# Patient Record
Sex: Male | Born: 1970 | Race: White | Hispanic: No | Smoking: Never smoker
Health system: Southern US, Community
[De-identification: ages and names within clinical notes are randomized; demographics above are authoritative.]

## PROBLEM LIST (undated history)

## (undated) DIAGNOSIS — K219 Gastro-esophageal reflux disease without esophagitis: Secondary | ICD-10-CM

## (undated) DIAGNOSIS — E7800 Pure hypercholesterolemia, unspecified: Secondary | ICD-10-CM

## (undated) HISTORY — DX: Gastro-esophageal reflux disease without esophagitis: K21.9

## (undated) HISTORY — PX: NO PAST SURGERIES: SHX2092

## (undated) HISTORY — DX: Pure hypercholesterolemia, unspecified: E78.00

---

## 1998-04-23 ENCOUNTER — Encounter: Admission: RE | Admit: 1998-04-23 | Discharge: 1998-07-22 | Payer: Self-pay | Admitting: Family Medicine

## 2004-05-10 ENCOUNTER — Emergency Department (HOSPITAL_COMMUNITY): Admission: EM | Admit: 2004-05-10 | Discharge: 2004-05-10 | Payer: Self-pay | Admitting: Emergency Medicine

## 2011-12-27 ENCOUNTER — Other Ambulatory Visit: Payer: Self-pay | Admitting: Family Medicine

## 2011-12-27 DIAGNOSIS — R1013 Epigastric pain: Secondary | ICD-10-CM

## 2012-01-09 ENCOUNTER — Ambulatory Visit (HOSPITAL_COMMUNITY)
Admission: RE | Admit: 2012-01-09 | Discharge: 2012-01-09 | Disposition: A | Discharge status: Home / Self Care | Payer: BC Managed Care – PPO | Source: Ambulatory Visit | Attending: Family Medicine | Admitting: Family Medicine

## 2012-01-09 ENCOUNTER — Other Ambulatory Visit: Payer: Self-pay | Admitting: Family Medicine

## 2012-01-09 DIAGNOSIS — R1013 Epigastric pain: Secondary | ICD-10-CM

## 2012-01-09 DIAGNOSIS — K3189 Other diseases of stomach and duodenum: Secondary | ICD-10-CM | POA: Insufficient documentation

## 2012-01-09 DIAGNOSIS — R109 Unspecified abdominal pain: Secondary | ICD-10-CM | POA: Insufficient documentation

## 2013-02-15 ENCOUNTER — Ambulatory Visit (INDEPENDENT_AMBULATORY_CARE_PROVIDER_SITE_OTHER): Payer: BC Managed Care – PPO | Admitting: Cardiovascular Disease

## 2013-02-15 ENCOUNTER — Encounter: Payer: Self-pay | Admitting: Cardiovascular Disease

## 2013-02-15 VITALS — BP 114/76 | HR 74 | Ht 72.0 in | Wt 192.0 lb

## 2013-02-15 DIAGNOSIS — E785 Hyperlipidemia, unspecified: Secondary | ICD-10-CM | POA: Insufficient documentation

## 2013-02-15 DIAGNOSIS — R079 Chest pain, unspecified: Secondary | ICD-10-CM | POA: Insufficient documentation

## 2013-02-15 DIAGNOSIS — R072 Precordial pain: Secondary | ICD-10-CM | POA: Insufficient documentation

## 2013-02-15 NOTE — Patient Instructions (Addendum)
Your physician has requested that you have an exercise tolerance test. For further information please visit [email or web address] Please also follow instruction sheet, as given.  Your physician wants you to follow-up in: 12 months with Dr. Excell Seltzer. You will receive a reminder letter in the mail two months in advance. If you don't receive a letter, please call our office to schedule the follow-up appointment.  Stop taking Red Yeast Rice and Fish Oil.

## 2013-02-15 NOTE — Progress Notes (Signed)
HPI:  42 year old gentleman presenting for initial cardiac evaluation. The patient has a history of chest pain and strong family history of premature coronary artery disease. His father had multivessel CABG at age 17.  The patient has developed chest pain at rest and with exertion. He describes a pressure like sensation across the chest with associated shortness of breath. He also has very bad acid reflux and has difficulty differentiating his symptoms. However, he has had chest pressure with physical exercise. He started taking Prilosec about 6 weeks ago and feels much better since this is been initiated. He's had no more resting chest discomfort. He has not resumed exercise because he's been afraid to do so. He denies leg swelling, palpitations, lightheadedness, syncope, orthopnea, or PND.  He has added red yeast rice and fish oil to his medical program. He's noticed some shoulder and elbow aching over the last 4 weeks since these medication changes were made. He's been maintained on simvastatin for many years.  Outpatient Encounter Prescriptions as of 02/15/2013  Medication Sig Dispense Refill  . aspirin 325 MG tablet Take 325 mg by mouth daily.      Marland Kitchen omeprazole (PRILOSEC) 40 MG capsule Take 40 mg by mouth daily.      . simvastatin (ZOCOR) 40 MG tablet Take 20 mg by mouth every evening.      . [DISCONTINUED] fish oil-omega-3 fatty acids 1000 MG capsule Take 2 g by mouth daily. 1500      . [DISCONTINUED] Red Yeast Rice Extract (RED YEAST RICE PO) Take by mouth daily.       No facility-administered encounter medications on file as of 02/15/2013.    Review of patient's allergies indicates no known allergies.  Past Medical History  Diagnosis Date  . Acid reflux   . High cholesterol     History reviewed. No pertinent past surgical history.  History   Social History  . Marital Status: Married    Spouse Name: N/A    Number of Children: N/A  . Years of Education: N/A   Occupational  History  . Not on file.   Social History Main Topics  . Smoking status: Never Smoker   . Smokeless tobacco: Not on file  . Alcohol Use: Yes  . Drug Use: No  . Sexually Active: Not on file   Other Topics Concern  . Not on file   Social History Narrative  . No narrative on file    Family History  Problem Relation Age of Onset  . Heart disease Father     ROS:  General: no fevers/chills/night sweats Eyes: no blurry vision, diplopia, or amaurosis ENT: no sore throat or hearing loss Resp: no cough, wheezing, or hemoptysis CV: no edema or palpitations GI: no abdominal pain, nausea, vomiting, diarrhea, or constipation GU: no dysuria, frequency, or hematuria Skin: no rash Neuro: no headache, numbness, tingling, or weakness of extremities Musculoskeletal: Shoulder and elbow pains Heme: no bleeding, DVT, or easy bruising Endo: no polydipsia or polyuria  BP 114/76  Pulse 74  Ht 6' (1.829 m)  Wt 192 lb (87.091 kg)  BMI 26.03 kg/m2  SpO2 97%  PHYSICAL EXAM: Pt is alert and oriented, WD, WN, in no distress. HEENT: normal Neck: JVP normal. Carotid upstrokes normal without bruits. No thyromegaly. Lungs: equal expansion, clear bilaterally CV: Apex is discrete and nondisplaced, RRR without murmur or gallop Abd: soft, NT, +BS, no bruit, no hepatosplenomegaly Back: no CVA tenderness Ext: no C/C/E  Femoral pulses 2+= without bruits        DP/PT pulses intact and = Skin: warm and dry without rash Neuro: CNII-XII intact             Strength intact = bilaterally  EKG:  EKG shows normal sinus rhythm 67 beats per minute, within normal limits.  ASSESSMENT AND PLAN: 1. Chest pain with typical and atypical features. Of primary concern is his family history of premature CAD. It is reassuring that his chest pain has improved with Prilosec. I have recommended that he have an exercise treadmill stress test. He appears physically fit and I think we'll be able to do well on a treadmill  test. His 12-lead EKG is within normal limits. I asked him not to resume regular exercise until his treadmill is completed. He will continue aspirin and his statin drug.  2. Hyperlipidemia. I reviewed his lab work from February. His LDL is 58. His LDL particle number is 767. HDL is 45, and total cholesterol is 125. As he has developed myalgias, I have recommended that he stop taking red yeast rice. I also recommended that he discontinue fish oil because of his problems with acid reflux. I think he should remain on a statin drug long-term.  For followup, he will have a treadmill study as detailed above. I would like to see him back in 12 months as long as his treadmill is within normal limits.  Andrew [name] 02/15/2013 1:34 PM

## 2013-02-27 ENCOUNTER — Encounter: Payer: Self-pay | Admitting: *Deleted

## 2013-02-27 ENCOUNTER — Encounter: Payer: Self-pay | Admitting: Cardiovascular Disease

## 2013-03-01 ENCOUNTER — Ambulatory Visit (INDEPENDENT_AMBULATORY_CARE_PROVIDER_SITE_OTHER): Payer: BC Managed Care – PPO | Admitting: Physician Assistant

## 2013-03-01 DIAGNOSIS — R079 Chest pain, unspecified: Secondary | ICD-10-CM

## 2013-03-01 NOTE — Progress Notes (Signed)
Exercise Treadmill Test  Pre-Exercise Testing Evaluation Rhythm: normal sinus  Rate: 68                 Test  Exercise Tolerance Test Ordering MD: Tonny Bollman, MD  Interpreting MD: Tereso Newcomer, PA-C  Unique Test No: 1  Treadmill:  1  Indication for ETT: chest pain - rule out ischemia  Contraindication to ETT: No   Stress Modality: exercise - treadmill  Cardiac Imaging Performed: non   Protocol: standard Bruce - maximal  Max BP:  166/81  Max MPHR (bpm):  179 85% MPR (bpm):  152  MPHR obtained (bpm):  190 % MPHR obtained:  106%  Reached 85% MPHR (min:sec):  8:37 Total Exercise Time (min-sec):  10:30  Workload in METS:  12.4 Borg Scale: 19  Reason ETT Terminated:  patient's desire to stop    ST Segment Analysis At Rest: normal ST segments - no evidence of significant ST depression With Exercise: no evidence of significant ST depression  Other Information Arrhythmia:  Frequent PVCs during exercise and recovery  Angina during ETT:  absent (0) Quality of ETT:  diagnostic  ETT Interpretation:  normal - no evidence of ischemia by ST analysis  Comments: Excellent exercise tolerance. No chest pain. Normal BP response to exercise. No ST-T changes to suggest ischemia.  Recommendations: F/u with Dr. Tonny Bollman as directed. Signed,  Tereso Newcomer, PA-C  1:09 PM 03/01/2013

## 2013-10-07 ENCOUNTER — Other Ambulatory Visit: Payer: Self-pay | Admitting: Family Medicine

## 2013-10-08 NOTE — Telephone Encounter (Signed)
Last seen 02/14

## 2013-12-09 ENCOUNTER — Other Ambulatory Visit: Payer: Self-pay | Admitting: Nurse Practitioner

## 2014-01-08 ENCOUNTER — Other Ambulatory Visit: Payer: Self-pay | Admitting: Nurse Practitioner

## 2014-01-09 NOTE — Telephone Encounter (Signed)
Last visit 01/07/13 with you

## 2014-01-10 NOTE — Telephone Encounter (Signed)
Patient needs to be seen. Has exceeded time since last visit. Limited quantity refilled. Needs to bring all medications to next appointment.   

## 2014-03-07 ENCOUNTER — Ambulatory Visit: Payer: BC Managed Care – PPO | Admitting: Cardiovascular Disease

## 2014-03-08 ENCOUNTER — Other Ambulatory Visit: Payer: Self-pay | Admitting: Family Medicine

## 2014-03-17 ENCOUNTER — Encounter: Payer: Self-pay | Admitting: Cardiovascular Disease

## 2014-03-19 ENCOUNTER — Ambulatory Visit: Payer: Self-pay | Admitting: Cardiovascular Disease

## 2014-04-24 ENCOUNTER — Telehealth: Payer: Self-pay | Admitting: Family Medicine

## 2014-04-24 ENCOUNTER — Ambulatory Visit (INDEPENDENT_AMBULATORY_CARE_PROVIDER_SITE_OTHER): Payer: BC Managed Care – PPO | Admitting: Family Medicine

## 2014-04-24 VITALS — BP 117/72 | HR 87 | Temp 99.0°F | Ht 72.0 in | Wt 189.0 lb

## 2014-04-24 DIAGNOSIS — J029 Acute pharyngitis, unspecified: Secondary | ICD-10-CM

## 2014-04-24 DIAGNOSIS — R509 Fever, unspecified: Secondary | ICD-10-CM

## 2014-04-24 LAB — POCT RAPID STREP A (OFFICE): Rapid Strep A Screen: POSITIVE — AB

## 2014-04-24 MED ORDER — CEFTRIAXONE SODIUM 1 G IJ SOLR
1.0000 g | INTRAMUSCULAR | Status: AC
  Administered 2014-04-24: 1 g via INTRAMUSCULAR

## 2014-04-24 MED ORDER — HYDROCODONE-ACETAMINOPHEN 5-325 MG PO TABS: 1.0000 | ORAL_TABLET | Freq: Four times a day (QID) | ORAL | Status: DC | PRN

## 2014-04-24 MED ORDER — PENICILLIN V POTASSIUM 500 MG PO TABS: 500.0000 mg | ORAL_TABLET | Freq: Four times a day (QID) | ORAL | Status: DC

## 2014-04-24 NOTE — Telephone Encounter (Signed)
appt given for 6:15 with bill

## 2014-04-24 NOTE — Progress Notes (Signed)
   Subjective:    Patient ID: Andrew [name], male    DOB: Jun 13, 1971, 43 y.o.   MRN: [id number]  HPI  This 43 y.o. male presents for evaluation of severe sore throat pain swollen tonsils and fever.  Review of Systems C/o sore throat and fever   No chest pain, SOB, HA, dizziness, vision change, N/V, diarrhea, constipation, dysuria, urinary urgency or frequency, myalgias, arthralgias or rash.  Objective:   Physical Exam Vital signs noted  Well developed well nourished male.  HEENT - Head atraumatic Normocephalic                Eyes - PERRLA, Conjuctiva - clear Sclera- Clear EOMI                Ears - EAC's Wnl TM's Wnl Gross Hearing WNL                Nose - Nares patent                 Throat - oropharanx 3 plus injected tonsils Respiratory - Lungs CTA bilateral Cardiac - RRR S1 and S2 without murmur GI - Abdomen soft Nontender and bowel sounds active x 4        Assessment & Plan:  Sore throat - Plan: POCT rapid strep A, penicillin v potassium (VEETID) 500 MG tablet, cefTRIAXone (ROCEPHIN) injection 1 g, HYDROcodone-acetaminophen (NORCO) 5-325 MG per tablet  Fever - Plan: POCT rapid strep A, penicillin v potassium (VEETID) 500 MG tablet, cefTRIAXone (ROCEPHIN) injection 1 g, HYDROcodone-acetaminophen (NORCO) 5-325 MG per tablet  Acute pharyngitis - Plan: penicillin v potassium (VEETID) 500 MG tablet, cefTRIAXone (ROCEPHIN) injection 1 g, HYDROcodone-acetaminophen (NORCO) 5-325 MG per tablet  Push po fluids, rest, tylenol and motrin otc prn as directed for fever, arthralgias, and myalgias.  Follow up prn if sx's continue or persist.  Deatra Canter FNP

## 2014-04-25 ENCOUNTER — Other Ambulatory Visit (INDEPENDENT_AMBULATORY_CARE_PROVIDER_SITE_OTHER): Payer: BC Managed Care – PPO

## 2014-04-25 DIAGNOSIS — E785 Hyperlipidemia, unspecified: Secondary | ICD-10-CM

## 2014-04-25 NOTE — Progress Notes (Signed)
Pt came in for lab  only 

## 2014-04-26 LAB — CMP14+EGFR
ALT: 16 IU/L (ref 0–44)
AST: 18 IU/L (ref 0–40)
Albumin/Globulin Ratio: 1.6 (ref 1.1–2.5)
Albumin: 4.4 g/dL (ref 3.5–5.5)
Alkaline Phosphatase: 97 IU/L (ref 39–117)
BUN/Creatinine Ratio: 10 (ref 9–20)
BUN: 10 mg/dL (ref 6–24)
CO2: 25 mmol/L (ref 18–29)
Calcium: 9.5 mg/dL (ref 8.7–10.2)
Chloride: 97 mmol/L (ref 97–108)
Creatinine, Ser: 1.03 mg/dL (ref 0.76–1.27)
GFR calc Af Amer: 103 mL/min/{1.73_m2} (ref >59)
GFR calc non Af Amer: 89 mL/min/{1.73_m2} (ref >59)
Globulin, Total: 2.7 g/dL (ref 1.5–4.5)
Glucose: 90 mg/dL (ref 65–99)
Potassium: 4.1 mmol/L (ref 3.5–5.2)
Sodium: 138 mmol/L (ref 134–144)
Total Bilirubin: 0.4 mg/dL (ref 0.0–1.2)
Total Protein: 7.1 g/dL (ref 6.0–8.5)

## 2014-04-26 LAB — LIPID PANEL
Chol/HDL Ratio: 3.8 ratio units (ref 0.0–5.0)
Cholesterol, Total: 158 mg/dL (ref 100–199)
HDL: 42 mg/dL (ref >39)
LDL Calculated: 98 mg/dL (ref 0–99)
Triglycerides: 91 mg/dL (ref 0–149)
VLDL Cholesterol Cal: 18 mg/dL (ref 5–40)

## 2015-10-01 ENCOUNTER — Ambulatory Visit (INDEPENDENT_AMBULATORY_CARE_PROVIDER_SITE_OTHER): Payer: 59 | Admitting: Family Medicine

## 2015-10-01 ENCOUNTER — Encounter: Payer: Self-pay | Admitting: Family Medicine

## 2015-10-01 VITALS — BP 122/77 | HR 84 | Temp 97.4°F | Ht 72.0 in | Wt 200.2 lb

## 2015-10-01 DIAGNOSIS — R0789 Other chest pain: Secondary | ICD-10-CM | POA: Diagnosis not present

## 2015-10-01 DIAGNOSIS — K219 Gastro-esophageal reflux disease without esophagitis: Secondary | ICD-10-CM | POA: Insufficient documentation

## 2015-10-01 MED ORDER — OMEPRAZOLE 40 MG PO CPDR: 40.0000 mg | DELAYED_RELEASE_CAPSULE | Freq: Every day | ORAL | Status: DC

## 2015-10-06 NOTE — Progress Notes (Signed)
BP 122/77 mmHg  Pulse 84  Temp(Src) 97.4 F (36.3 C) (Oral)  Ht 6' (1.829 m)  Wt 200 lb 3.2 oz (90.81 kg)  BMI 27.15 kg/m2   Subjective:    Patient ID: Andrew [name], male    DOB: 11/30/1970, 44 y.o.   MRN: [id number]  HPI: Andrew [name] is a 44 y.o. male presenting on 10/01/2015 for Gastroesophageal Reflux   HPI  Chest pressure and heartburn Patient presents today with chest pressure and tightness and heartburn. He has been taking omeprazole 20 mg daily for a few years but feels like it is not working as well as it used to. He describes it as an indigestion and chest tightness up in the epigastric region and lower left chest. He denies any palpitations or shortness of breath. He is concerned about his heart because of extensive family history and wonders if he can get an EKG. He denies any lightheadedness or dizziness. He does admit to belching and burping associated with it and the occasional burning going up into his throat.  Relevant past medical, surgical, family and social history reviewed and updated as indicated. Interim medical history since our last visit reviewed. Allergies and medications reviewed and updated.  Review of Systems  Constitutional: Negative for fever.  HENT: Negative for ear discharge and ear pain.   Eyes: Negative for discharge and visual disturbance.  Respiratory: Negative for shortness of breath and wheezing.   Cardiovascular: Positive for chest pain (pressure in the lower left ribs). Negative for leg swelling.  Gastrointestinal: Positive for nausea (Belching and burping and acid) and abdominal pain. Negative for diarrhea, constipation and abdominal distention.  Genitourinary: Negative for difficulty urinating.  Musculoskeletal: Negative for back pain and gait problem.  Skin: Negative for rash.  Neurological: Negative for dizziness, syncope, light-headedness and headaches.  All other systems reviewed and are negative.   Per HPI unless  specifically indicated above     Medication List       This list is accurate as of: 10/01/15 11:59 PM.  Always use your most recent med list.               aspirin 325 MG tablet  Take 325 mg by mouth daily.     omeprazole 40 MG capsule  Commonly known as:  PRILOSEC  Take 1 capsule (40 mg total) by mouth daily.           Objective:    BP 122/77 mmHg  Pulse 84  Temp(Src) 97.4 F (36.3 C) (Oral)  Ht 6' (1.829 m)  Wt 200 lb 3.2 oz (90.81 kg)  BMI 27.15 kg/m2  Wt Readings from Last 3 Encounters:  10/01/15 200 lb 3.2 oz (90.81 kg)  04/24/14 189 lb (85.73 kg)  02/15/13 192 lb (87.091 kg)    Physical Exam  Constitutional: He is oriented to person, place, and time. He appears well-developed and well-nourished. No distress.  Eyes: Conjunctivae and EOM are normal. Pupils are equal, round, and reactive to light. Right eye exhibits no discharge. No scleral icterus.  Neck: Neck supple. No thyromegaly present.  Cardiovascular: Normal rate, regular rhythm, normal heart sounds and intact distal pulses.   No murmur heard. Pulmonary/Chest: Effort normal and breath sounds normal. No respiratory distress. He has no wheezes.  Abdominal: Soft. Bowel sounds are normal. He exhibits no distension. There is tenderness (Epigastric). There is no rebound and no guarding.  Musculoskeletal: Normal range of motion. He exhibits no edema.  Lymphadenopathy:  He has no cervical adenopathy.  Neurological: He is alert and oriented to person, place, and time. Coordination normal.  Skin: Skin is warm and dry. No rash noted. He is not diaphoretic.  Psychiatric: He has a normal mood and affect. His behavior is normal.  Vitals reviewed.   Results for orders placed or performed in visit on 04/25/14  CMP14+EGFR  Result Value Ref Range   Glucose 90 65 - 99 mg/dL   BUN 10 6 - 24 mg/dL   Creatinine, Ser 1.03 0.76 - 1.27 mg/dL   GFR calc non Af Amer 89 >59 mL/min/1.73   GFR calc Af Amer 103 >59  mL/min/1.73   BUN/Creatinine Ratio 10 9 - 20   Sodium 138 134 - 144 mmol/L   Potassium 4.1 3.5 - 5.2 mmol/L   Chloride 97 97 - 108 mmol/L   CO2 25 18 - 29 mmol/L   Calcium 9.5 8.7 - 10.2 mg/dL   Total Protein 7.1 6.0 - 8.5 g/dL   Albumin 4.4 3.5 - 5.5 g/dL   Globulin, Total 2.7 1.5 - 4.5 g/dL   Albumin/Globulin Ratio 1.6 1.1 - 2.5   Total Bilirubin 0.4 0.0 - 1.2 mg/dL   Alkaline Phosphatase 97 39 - 117 IU/L   AST 18 0 - 40 IU/L   ALT 16 0 - 44 IU/L  Lipid panel  Result Value Ref Range   Cholesterol, Total 158 100 - 199 mg/dL   Triglycerides 91 0 - 149 mg/dL   HDL 42 >39 mg/dL   VLDL Cholesterol Cal 18 5 - 40 mg/dL   LDL Calculated 98 0 - 99 mg/dL   Chol/HDL Ratio 3.8 0.0 - 5.0 ratio units   EKG: EKG shows normal sinus rhythm    Assessment & Plan:       Problem List Items Addressed This Visit      Digestive   GERD (gastroesophageal reflux disease) - Primary   Relevant Medications   omeprazole (PRILOSEC) 40 MG capsule    Other Visit Diagnoses    Chest tightness or pressure        Relevant Orders    EKG 12-Lead (Completed)        Follow up plan: Return if symptoms worsen or fail to improve.  Caryl Pina, MD [facility] 10/01/2015, 5:01 PM

## 2015-12-21 ENCOUNTER — Encounter: Payer: Self-pay | Admitting: Pediatrics

## 2015-12-21 ENCOUNTER — Telehealth: Payer: Self-pay | Admitting: *Deleted

## 2015-12-21 ENCOUNTER — Ambulatory Visit (INDEPENDENT_AMBULATORY_CARE_PROVIDER_SITE_OTHER): Payer: BLUE CROSS/BLUE SHIELD | Admitting: Pediatrics

## 2015-12-21 VITALS — BP 114/79 | HR 97 | Temp 97.7°F | Ht 73.0 in | Wt 199.2 lb

## 2015-12-21 DIAGNOSIS — J020 Streptococcal pharyngitis: Secondary | ICD-10-CM

## 2015-12-21 DIAGNOSIS — R6889 Other general symptoms and signs: Secondary | ICD-10-CM

## 2015-12-21 LAB — POCT INFLUENZA A/B
Influenza A, POC: NEGATIVE
Influenza B, POC: NEGATIVE

## 2015-12-21 MED ORDER — GUAIFENESIN-CODEINE 100-10 MG/5ML PO SOLN: 5.0000 mL | Freq: Three times a day (TID) | ORAL | Status: DC | PRN

## 2015-12-21 MED ORDER — AMOXICILLIN 500 MG PO CAPS: 500.0000 mg | ORAL_CAPSULE | Freq: Two times a day (BID) | ORAL | Status: DC

## 2015-12-21 NOTE — Patient Instructions (Addendum)
Ibuprofen  three times a day--no more than  in 24hours  Tylenol  tabs, take 2 no more than 2-3 times a day  Chloraseptic or hurricane throat spray for numbing throat

## 2015-12-21 NOTE — Progress Notes (Signed)
Subjective:    Patient ID: Andrew [name], male    DOB: 1971/11/25, 45 y.o.   MRN: [id number]  CC: Fatigue; Sore Throat; Fever; Chills; and Generalized Body Aches   HPI: Andrew [name] is a 45 y.o. male presenting for Fatigue; Sore Throat; Fever; Chills; and Generalized Body Aches  Woke up 2 days ago feeling completely drained, not able to do weekend plans, not hungry for dinner that night, started having sore throat, had fevers at home up to 102. Has had back pain, cant get comfortable   Depression screen [facility] 2/9 12/21/2015 10/01/2015  Decreased Interest 0 0  Down, Depressed, Hopeless 0 0  PHQ - 2 Score 0 0     Relevant past medical, surgical, family and social history reviewed and updated as indicated. Interim medical history since our last visit reviewed. Allergies and medications reviewed and updated.    ROS: Per HPI unless specifically indicated above  History  Smoking status  . Never Smoker   Smokeless tobacco  . Not on file    Past Medical History Patient Active Problem List   Diagnosis Date Noted  . GERD (gastroesophageal reflux disease) 10/01/2015  . Chest pain 02/15/2013  . Hyperlipidemia 02/15/2013    Current Outpatient Prescriptions  Medication Sig Dispense Refill  . omeprazole (PRILOSEC) 40 MG capsule Take 1 capsule (40 mg total) by mouth daily. 30 capsule 5  . amoxicillin (AMOXIL) 500 MG capsule Take 1 capsule (500 mg total) by mouth 2 (two) times daily. 20 capsule 0  . aspirin 325 MG tablet Take 325 mg by mouth daily. Reported on 12/21/2015    . guaiFENesin-codeine 100-10 MG/5ML syrup Take 5 mLs by mouth 3 (three) times daily as needed for cough. 180 mL 0   No current facility-administered medications for this visit.       Objective:    BP 114/79 mmHg  Pulse 97  Temp(Src) 97.7 F (36.5 C) (Oral)  Ht  (1.854 m)  Wt 199 lb 3.2 oz (90.357 kg)  BMI 26.29 kg/m2  Wt Readings from Last 3 Encounters:  12/21/15 199 lb 3.2 oz  (90.357 kg)  10/01/15 200 lb 3.2 oz (90.81 kg)  04/24/14 189 lb (85.73 kg)     Gen: NAD, alert, cooperative with exam, NCAT EYES: EOMI, no scleral injection or icterus ENT:  TMs pearly gray b/l, OP with erythema, large generous red tonsils.  LYMPH: no cervical LAD CV: NRRR, normal S1/S2, no murmur, distal pulses 2+ b/l Resp: CTABL, no wheezes, normal WOB Abd: +BS, soft, NTND. no guarding or organomegaly Ext: No edema, warm Neuro: Alert and oriented, strength equal b/l UE and LE, coordination grossly normal MSK: normal muscle bulk, no point tenderness over back     Assessment & Plan:    Andrew [name] was seen today for fatigue, sore throat, fever, chills and generalized body aches, neg flu, but due to tonsil inflammation checked rapid strep which was positive. Will send in amoxicillin  twice a day. Discussed symptomatic care, return precautions.   Diagnoses and all orders for this visit:  Flu-like symptoms -     POCT Influenza A/B -     guaiFENesin-codeine 100-10 MG/5ML syrup; Take 5 mLs by mouth 3 (three) times daily as needed for cough.  Streptococcal sore throat -     amoxicillin (AMOXIL) 500 MG capsule; Take 1 capsule (500 mg total) by mouth 2 (two) times daily.   Follow up plan: prn  Rex Kras, MD [place] [facility]  12/21/2015, 1:53 PM

## 2015-12-21 NOTE — Telephone Encounter (Signed)
Patient aware.

## 2015-12-21 NOTE — Telephone Encounter (Signed)
-----   Message from Johna Sheriff, MD sent at 12/21/2015  1:57 PM EST ----- Regarding: positive strep Can you let him know his strep test was positive, I sent in amoxicillin,  BID for ten days. Take all ten days even if better. He should still do all the other symptomatic care like we discussed.

## 2016-01-11 ENCOUNTER — Encounter: Payer: Self-pay | Admitting: Family Medicine

## 2016-01-11 ENCOUNTER — Ambulatory Visit (INDEPENDENT_AMBULATORY_CARE_PROVIDER_SITE_OTHER): Payer: BLUE CROSS/BLUE SHIELD | Admitting: Family Medicine

## 2016-01-11 VITALS — BP 122/84 | HR 71 | Temp 97.3°F | Ht 73.0 in | Wt 201.0 lb

## 2016-01-11 DIAGNOSIS — G4730 Sleep apnea, unspecified: Secondary | ICD-10-CM | POA: Diagnosis not present

## 2016-01-11 DIAGNOSIS — E669 Obesity, unspecified: Secondary | ICD-10-CM

## 2016-01-11 DIAGNOSIS — R0789 Other chest pain: Secondary | ICD-10-CM | POA: Diagnosis not present

## 2016-01-11 NOTE — Progress Notes (Signed)
BP 122/84 mmHg  Pulse 71  Temp(Src) 97.3 F (36.3 C) (Oral)  Ht '6\' 1"'  (1.854 m)  Wt 201 lb (91.173 kg)  BMI 26.52 kg/m2   Subjective:    Patient ID: Andrew [name], male    DOB: 12-08-1970, 45 y.o.   MRN: [id number]  HPI: Andrew [name] is a 45 y.o. male presenting on 01/11/2016 for Discomfort in chest and Possible sleep apnea   HPI Chest pain and pressure Patient has experienced a chest pain on the left lateral ribs extending around to his back. The pain began 9 days ago. He experiences the pain intermittently when he twists or deep breathing or coughs extensively. He had chest pains that were similar but different back in November and had cardiac workup. He denies any cough or shortness of breath or wheezing currently. Initially then the reflux medications helped him extensively to get rid of the that time. Pain is worse upon palpation of the area.  Snoring and gasping for air Patient admits that his girlfriend has recently been telling him that he snoring and taking long pauses between breaths when he is sleeping and sometimes eating gasping for air. He does not know or wake up during these episodes. He does admit that his sleep is non-refreshing and he often feels tired throughout the day.  Relevant past medical, surgical, family and social history reviewed and updated as indicated. Interim medical history since our last visit reviewed. Allergies and medications reviewed and updated.  Review of Systems  Constitutional: Positive for fatigue. Negative for fever and chills.  HENT: Negative for congestion, ear discharge, ear pain, sinus pressure, sneezing and sore throat.   Eyes: Negative for discharge and visual disturbance.  Respiratory: Negative for cough, chest tightness, shortness of breath and wheezing.   Cardiovascular: Positive for chest pain. Negative for palpitations and leg swelling.  Gastrointestinal: Negative for abdominal pain, diarrhea and constipation.    Genitourinary: Negative for difficulty urinating.  Musculoskeletal: Negative for back pain and gait problem.  Skin: Negative for rash.  Neurological: Negative for syncope, light-headedness and headaches.  Psychiatric/Behavioral: Positive for sleep disturbance.  All other systems reviewed and are negative.   Per HPI unless specifically indicated above     Medication List       This list is accurate as of: 01/11/16  6:12 PM.  Always use your most recent med list.               aspirin 325 MG tablet  Take 325 mg by mouth daily. Reported on 12/21/2015     omeprazole 40 MG capsule  Commonly known as:  PRILOSEC  Take 1 capsule (40 mg total) by mouth daily.           Objective:    BP 122/84 mmHg  Pulse 71  Temp(Src) 97.3 F (36.3 C) (Oral)  Ht '6\' 1"'  (1.854 m)  Wt 201 lb (91.173 kg)  BMI 26.52 kg/m2  Wt Readings from Last 3 Encounters:  01/11/16 201 lb (91.173 kg)  12/21/15 199 lb 3.2 oz (90.357 kg)  10/01/15 200 lb 3.2 oz (90.81 kg)    Physical Exam  Constitutional: He is oriented to person, place, and time. He appears well-developed and well-nourished. No distress.  HENT:  Right Ear: External ear normal.  Left Ear: External ear normal.  Nose: Nose normal.  Mouth/Throat: Oropharynx is clear and moist. No oropharyngeal exudate.  Eyes: Conjunctivae and EOM are normal. Pupils are equal, round, and reactive to light. Right  eye exhibits no discharge. No scleral icterus.  Neck: Neck supple. No thyromegaly present.  Cardiovascular: Normal rate, regular rhythm, normal heart sounds and intact distal pulses.   No murmur heard. Pulmonary/Chest: Effort normal and breath sounds normal. No respiratory distress. He has no wheezes. He exhibits tenderness (Left lateral chest tenderness along the lower ribs extending around posteriorly).  Abdominal: He exhibits no distension. There is no tenderness. There is no rebound and no guarding.  Musculoskeletal: Normal range of motion. He  exhibits no edema.  Lymphadenopathy:    He has no cervical adenopathy.  Neurological: He is alert and oriented to person, place, and time. Coordination normal.  Skin: Skin is warm and dry. No rash noted. He is not diaphoretic.  Psychiatric: He has a normal mood and affect. His behavior is normal.  Vitals reviewed.   Results for orders placed or performed in visit on 12/21/15  POCT Influenza A/B  Result Value Ref Range   Influenza A, POC Negative Negative   Influenza B, POC Negative Negative      Assessment & Plan:   Problem List Items Addressed This Visit      Other   Chest pain - Primary    Other Visit Diagnoses    Sleep apnea        Patient is snoring with signs of sleep apnea would like to go for sleep study.    Relevant Orders    Ambulatory referral to Sleep Studies    CBC with Differential/Platelet (Completed)    Obesity        Relevant Orders    CMP14+EGFR (Completed)    Lipid panel (Completed)    TSH (Completed)    CBC with Differential/Platelet (Completed)        Follow up plan: Return if symptoms worsen or fail to improve.  Counseling provided for all of the vaccine components Orders Placed This Encounter  Procedures  . CMP14+EGFR  . Lipid panel  . TSH  . CBC with Differential/Platelet  . Ambulatory referral to Sleep Studies    Caryl Pina, MD [facility] 01/11/2016, 6:12 PM

## 2016-01-12 ENCOUNTER — Other Ambulatory Visit: Payer: BLUE CROSS/BLUE SHIELD

## 2016-01-12 DIAGNOSIS — G4730 Sleep apnea, unspecified: Secondary | ICD-10-CM

## 2016-01-12 DIAGNOSIS — E669 Obesity, unspecified: Secondary | ICD-10-CM

## 2016-01-13 LAB — CMP14+EGFR
A/G RATIO: 1.3 (ref 1.1–2.5)
ALBUMIN: 4.1 g/dL (ref 3.5–5.5)
ALK PHOS: 91 IU/L (ref 39–117)
ALT: 29 IU/L (ref 0–44)
AST: 17 IU/L (ref 0–40)
BUN / CREAT RATIO: 15 (ref 9–20)
BUN: 17 mg/dL (ref 6–24)
Bilirubin Total: 0.3 mg/dL (ref 0.0–1.2)
CO2: 24 mmol/L (ref 18–29)
CREATININE: 1.15 mg/dL (ref 0.76–1.27)
Calcium: 9.2 mg/dL (ref 8.7–10.2)
Chloride: 99 mmol/L (ref 96–106)
GFR calc Af Amer: 90 mL/min/{1.73_m2} (ref >59)
GFR, EST NON AFRICAN AMERICAN: 78 mL/min/{1.73_m2} (ref >59)
GLOBULIN, TOTAL: 3.1 g/dL (ref 1.5–4.5)
Glucose: 96 mg/dL (ref 65–99)
POTASSIUM: 4.5 mmol/L (ref 3.5–5.2)
SODIUM: 140 mmol/L (ref 134–144)
Total Protein: 7.2 g/dL (ref 6.0–8.5)

## 2016-01-13 LAB — CBC WITH DIFFERENTIAL/PLATELET
BASOS ABS: 0 10*3/uL (ref 0.0–0.2)
Basos: 1 %
EOS (ABSOLUTE): 0.7 10*3/uL — AB (ref 0.0–0.4)
Eos: 9 %
Hematocrit: 47 % (ref 37.5–51.0)
Hemoglobin: 16.4 g/dL (ref 12.6–17.7)
IMMATURE GRANS (ABS): 0 10*3/uL (ref 0.0–0.1)
IMMATURE GRANULOCYTES: 0 %
LYMPHS: 34 %
Lymphocytes Absolute: 2.5 10*3/uL (ref 0.7–3.1)
MCH: 32.5 pg (ref 26.6–33.0)
MCHC: 34.9 g/dL (ref 31.5–35.7)
MCV: 93 fL (ref 79–97)
MONOS ABS: 0.6 10*3/uL (ref 0.1–0.9)
Monocytes: 8 %
NEUTROS PCT: 48 %
Neutrophils Absolute: 3.5 10*3/uL (ref 1.4–7.0)
PLATELETS: 250 10*3/uL (ref 150–379)
RBC: 5.05 x10E6/uL (ref 4.14–5.80)
RDW: 13 % (ref 12.3–15.4)
WBC: 7.3 10*3/uL (ref 3.4–10.8)

## 2016-01-13 LAB — TSH: TSH: 6.85 u[IU]/mL — ABNORMAL HIGH (ref 0.450–4.500)

## 2016-01-13 LAB — LIPID PANEL
CHOL/HDL RATIO: 5 ratio (ref 0.0–5.0)
Cholesterol, Total: 171 mg/dL (ref 100–199)
HDL: 34 mg/dL — ABNORMAL LOW (ref >39)
LDL CALC: 111 mg/dL — AB (ref 0–99)
Triglycerides: 128 mg/dL (ref 0–149)
VLDL Cholesterol Cal: 26 mg/dL (ref 5–40)

## 2016-01-14 LAB — SPECIMEN STATUS REPORT

## 2016-01-14 LAB — T4, FREE: FREE T4: 1.05 ng/dL (ref 0.82–1.77)

## 2016-01-15 ENCOUNTER — Telehealth: Payer: Self-pay | Admitting: *Deleted

## 2016-01-15 MED ORDER — LEVOTHYROXINE SODIUM 50 MCG PO TABS: 50.0000 ug | ORAL_TABLET | Freq: Every day | ORAL | Status: DC

## 2016-01-15 NOTE — Telephone Encounter (Signed)
-----   Message from Nils Pyle, MD sent at 01/14/2016  9:33 PM EST ----- Patient has early hypothyroidism and needs to be started on levothyroxine 50 mg daily give 30 days with 2 refills and have him come back in 6-8 weeks to recheck the level. Arville Care, MD [facility] 01/14/2016, 9:33 PM

## 2016-01-15 NOTE — Telephone Encounter (Signed)
Patient aware of lab results.  Rx sent to [facility]

## 2016-01-22 ENCOUNTER — Ambulatory Visit (INDEPENDENT_AMBULATORY_CARE_PROVIDER_SITE_OTHER): Payer: BLUE CROSS/BLUE SHIELD

## 2016-01-22 ENCOUNTER — Ambulatory Visit (INDEPENDENT_AMBULATORY_CARE_PROVIDER_SITE_OTHER): Payer: BLUE CROSS/BLUE SHIELD | Admitting: Internal Medicine

## 2016-01-22 ENCOUNTER — Encounter: Payer: Self-pay | Admitting: Internal Medicine

## 2016-01-22 ENCOUNTER — Telehealth: Payer: Self-pay

## 2016-01-22 ENCOUNTER — Other Ambulatory Visit: Payer: BLUE CROSS/BLUE SHIELD

## 2016-01-22 VITALS — BP 126/74 | HR 90 | Ht 72.0 in | Wt 202.2 lb

## 2016-01-22 DIAGNOSIS — R079 Chest pain, unspecified: Secondary | ICD-10-CM | POA: Diagnosis not present

## 2016-01-22 DIAGNOSIS — K219 Gastro-esophageal reflux disease without esophagitis: Secondary | ICD-10-CM

## 2016-01-22 DIAGNOSIS — G4733 Obstructive sleep apnea (adult) (pediatric): Secondary | ICD-10-CM | POA: Diagnosis not present

## 2016-01-22 NOTE — Telephone Encounter (Signed)
Patient is still having pain in his rib area of the left side of his chest, he has been taking Ibuprofen as you recommended.  Do you think he should come back in to be seen and xrayed?

## 2016-01-22 NOTE — Telephone Encounter (Signed)
Have him come in and do a rib x-ray and chest x-ray

## 2016-01-22 NOTE — Telephone Encounter (Signed)
Pt aware.

## 2016-01-22 NOTE — Assessment & Plan Note (Signed)
Reflux precautions 

## 2016-01-22 NOTE — Assessment & Plan Note (Signed)
Diagnosis is probable based on history and physical exam. We discussed the basics. His insurance requires an attended sleep study. Plan-split protocol sleep study

## 2016-01-22 NOTE — Progress Notes (Signed)
01/22/2016-45 year old male for smoker referred courtesy of Dr Museum/gallery exhibitions officer; never had sleep study. Pt stops breathing and snoring. Medical history of hypothyroid and GERD Code friend has told him loud breathing and witnessed apneas. He wakes is tired as he went to bed. Little aware of waking after sleep onset. Caffeine use-4 soft drinks per day. No ENT surgery. No active lung disease. Both parents use CPAP. Works as a Chartered certified accountant  Prior to Admission medications   Medication Sig Start Date End Date Taking? Authorizing Provider  aspirin 325 MG tablet Take 325 mg by mouth daily. Reported on 12/21/2015   Yes Historical Provider, MD  levothyroxine (SYNTHROID, LEVOTHROID) 50 MCG tablet Take 1 tablet (50 mcg total) by mouth daily. 01/15/16  Yes Elige Radon Dettinger, MD  omeprazole (PRILOSEC) 40 MG capsule Take 1 capsule (40 mg total) by mouth daily. 10/01/15  Yes Nils Pyle, MD   Past Medical History  Diagnosis Date  . Acid reflux   . High cholesterol    No past surgical history on file. Family History  Problem Relation Age of Onset  . Heart disease Father    Social History   Social History  . Marital Status: Divorced    Spouse Name: N/A  . Number of Children: 3  . Years of Education: N/A   Occupational History  . machine operator    Social History Main Topics  . Smoking status: Never Smoker   . Smokeless tobacco: Current User    Types: Snuff     Comment: 1 can a day  . Alcohol Use: 0.0 oz/week    0 Standard drinks or equivalent per week     Comment: occasionally-beer  . Drug Use: No  . Sexual Activity: Not on file   Other Topics Concern  . Not on file   Social History Narrative   ROS-see HPI   Negative unless "+" Constitutional:    weight loss, night sweats, fevers, chills, + fatigue, lassitude. HEENT:    headaches, difficulty swallowing, tooth/dental problems, sore throat,       sneezing, itching, ear ache, nasal congestion, post nasal drip, snoring CV:    chest pain,  orthopnea, PND, swelling in lower extremities, anasarca,                                                   dizziness, palpitations Resp:   shortness of breath with exertion or at rest.                productive cough,   non-productive cough, coughing up of blood.              change in color of mucus.  wheezing.   Skin:    rash or lesions. GI:  No-   heartburn, indigestion, abdominal pain, nausea, vomiting, diarrhea,                 change in bowel habits, loss of appetite GU: dysuria, change in color of urine, no urgency or frequency.   flank pain. MS:   joint pain, stiffness, decreased range of motion, back pain. Neuro-     nothing unusual Psych:  change in mood or affect.  depression or anxiety.   memory loss.  OBJ- Physical Exam General- Alert, Oriented, Affect-appropriate, Distress- none acute, not overweight Skin- rash-none, lesions- none, excoriation- none Lymphadenopathy- none Head-  atraumatic            Eyes- Gross vision intact, PERRLA, conjunctivae and secretions clear            Ears- Hearing, canals-normal            Nose- Clear, no-Septal dev, mucus+, polyps, erosion, perforation             Throat- Mallampati III , mucosa clear , drainage- none, tonsils- atrophic Neck- flexible , trachea midline, no stridor , thyroid nl, carotid no bruit Chest - symmetrical excursion , unlabored           Heart/CV- RRR , no murmur , no gallop  , no rub, nl s1 s2                           - JVD- none , edema- none, stasis changes- none, varices- none           Lung- clear to P&A, wheeze- none, cough- none , dullness-none, rub- none           Chest wall-  Abd-  Br/ Gen/ Rectal- Not done, not indicated Extrem- cyanosis- none, clubbing, none, atrophy- none, strength- nl Neuro- grossly intact to observation

## 2016-01-22 NOTE — Patient Instructions (Signed)
Order- schedule unattended Home Sleep Test      Dx OSA    

## 2016-03-27 ENCOUNTER — Ambulatory Visit (HOSPITAL_BASED_OUTPATIENT_CLINIC_OR_DEPARTMENT_OTHER): Payer: BLUE CROSS/BLUE SHIELD | Attending: Internal Medicine | Admitting: Internal Medicine

## 2016-03-27 VITALS — Ht 72.0 in | Wt 200.0 lb

## 2016-03-27 DIAGNOSIS — R0683 Snoring: Secondary | ICD-10-CM | POA: Insufficient documentation

## 2016-03-27 DIAGNOSIS — G4733 Obstructive sleep apnea (adult) (pediatric): Secondary | ICD-10-CM | POA: Diagnosis not present

## 2016-03-27 DIAGNOSIS — Z9989 Dependence on other enabling machines and devices: Secondary | ICD-10-CM

## 2016-04-02 DIAGNOSIS — G4733 Obstructive sleep apnea (adult) (pediatric): Secondary | ICD-10-CM | POA: Diagnosis not present

## 2016-04-02 NOTE — Procedures (Signed)
Patient Name: Andrew [name], Andrew [name] Date: 03/27/2016 Gender: Male D.O.B: December 06, 1970 Age (years): 54 Referring Provider: Baird Lyons MD, ABSM Height (inches): 72 Interpreting Physician: Baird Lyons MD, ABSM Weight (lbs): 200 RPSGT: Jonna Coup BMI: 27 MRN: [id number] Neck Size: 18.00 CLINICAL INFORMATION Sleep Study Type: Split Night CPAP   Indication for sleep study: OSA   Epworth Sleepiness Score:3/24  SLEEP STUDY TECHNIQUE As per the AASM Manual for the Scoring of Sleep and Associated Events v2.3 (April 2016) with a hypopnea requiring 4% desaturations. The channels recorded and monitored were frontal, central and occipital EEG, electrooculogram (EOG), submentalis EMG (chin), nasal and oral airflow, thoracic and abdominal wall motion, anterior tibialis EMG, snore microphone, electrocardiogram, and pulse oximetry. Continuous positive airway pressure (CPAP) was initiated when the patient met split night criteria and was titrated according to treat sleep-disordered breathing.  MEDICATIONS Medications taken by the patient : charted for review Medications administered by patient during sleep study : No sleep medicine administered.  RESPIRATORY PARAMETERS Diagnostic Total AHI (/hr): 40.1 RDI (/hr): 40.5 OA Index (/hr): - CA Index (/hr): 0.4 REM AHI (/hr): 2.7 NREM AHI (/hr): 46.8 Supine AHI (/hr): 69.5 Non-supine AHI (/hr): 0.94 Min O2 Sat (%): 85.00 Mean O2 (%): 93.73 Time below 88% (min): 7.9   Titration Optimal Pressure (cm): 9 AHI at Optimal Pressure (/hr): 0.0 Min O2 at Optimal Pressure (%): 94.0 Supine % at Optimal (%): 38 Sleep % at Optimal (%): 97    SLEEP ARCHITECTURE The recording time for the entire night was 363.4 minutes. During a baseline period of 176.9 minutes, the patient slept for 149.5 minutes in REM and nonREM, yielding a sleep efficiency of 84.5%. Sleep onset after lights out was 5.8 minutes with a REM latency of 92.0 minutes. The patient spent  4.01% of the night in stage N1 sleep, 80.94% in stage N2 sleep, 0.00% in stage N3 and 15.05% in REM. During the titration period of 184.4 minutes, the patient slept for 170.5 minutes in REM and nonREM, yielding a sleep efficiency of 92.5%. Sleep onset after CPAP initiation was 8.8 minutes with a REM latency of 57.0 minutes. The patient spent 2.64% of the night in stage N1 sleep, 76.54% in stage N2 sleep, 0.00% in stage N3 and 20.82% in REM.  CARDIAC DATA The 2 lead EKG demonstrated sinus rhythm. The mean heart rate was 60.66 beats per minute. Other EKG findings include: None.  LEG MOVEMENT DATA The total Periodic Limb Movements of Sleep (PLMS) were 48. The PLMS index was 9.00 .  IMPRESSIONS - Severe obstructive sleep apnea occurred during the diagnostic portion of the study (AHI = 40.1/hour). An optimal PAP pressure was selected for this patient ( 9 cm of water) - No significant central sleep apnea occurred during the diagnostic portion of the study (CAI = 0.4/hour). - The patient had minimal or no oxygen desaturation during the diagnostic portion of the study (Min O2 = 85.00%) - The patient snored with Loud snoring volume during the diagnostic portion of the study. - No cardiac abnormalities were noted during this study. - Mild periodic limb movements of sleep occurred during the study.  DIAGNOSIS - Obstructive Sleep Apnea (327.23 [G47.33 ICD-10])   RECOMMENDATIONS - Trial of CPAP therapy on 9 cm H2O with a Small size Resmed Full Face Mask AirFit F20 mask and heated humidification. - Avoid alcohol, sedatives and other CNS depressants that may worsen sleep apnea and disrupt normal sleep architecture. - Sleep hygiene should be reviewed to assess factors  that may improve sleep quality. - Weight management and regular exercise should be initiated or continued.  YOUNG,CLINTON D Diplomate, American Board of Sleep Medicine  ELECTRONICALLY SIGNED ON:  04/02/2016, 11:43 AM [facility] PH: [phone]   FX: [phone] ACCREDITED BY THE AMERICAN ACADEMY OF SLEEP MEDICINE  

## 2016-04-28 ENCOUNTER — Encounter: Payer: Self-pay | Admitting: Internal Medicine

## 2016-04-28 ENCOUNTER — Ambulatory Visit (INDEPENDENT_AMBULATORY_CARE_PROVIDER_SITE_OTHER): Payer: BLUE CROSS/BLUE SHIELD | Admitting: Internal Medicine

## 2016-04-28 VITALS — BP 124/78 | HR 71 | Ht 72.0 in | Wt 201.2 lb

## 2016-04-28 DIAGNOSIS — G4733 Obstructive sleep apnea (adult) (pediatric): Secondary | ICD-10-CM

## 2016-04-28 NOTE — Progress Notes (Signed)
01/22/2016-45 year old male for smoker referred courtesy of Dr Museum/gallery exhibitions officer; never had sleep study. Pt stops breathing and snoring. Medical history of hypothyroid and GERD Code friend has told him loud breathing and witnessed apneas. He wakes is tired as he went to bed. Little aware of waking after sleep onset. Caffeine use-4 soft drinks per day. No ENT surgery. No active lung disease. Both parents use CPAP. Works as a Chartered certified accountant  04/28/2016-45 year old male former smoker followed for OSA complicated by GERD NPSG 03/27/16-AHI 40.1/hour, desaturation to 85%, CPAP titration to 9, body weight 200 pounds FOLLOWS FOR: Review sleep study from 03/27/16 with patient. Sleep study discussed in detail and treatment options reviewed. He primarily wants to wake up in the morning feeling rested and is willing to try CPAP first.  ROS-see HPI   Negative unless "+" Constitutional:    weight loss, night sweats, fevers, chills, + fatigue, lassitude. HEENT:    headaches, difficulty swallowing, tooth/dental problems, sore throat,       sneezing, itching, ear ache, nasal congestion, post nasal drip, snoring CV:    chest pain, orthopnea, PND, swelling in lower extremities, anasarca,                                                   dizziness, palpitations Resp:   shortness of breath with exertion or at rest.                productive cough,   non-productive cough, coughing up of blood.              change in color of mucus.  wheezing.   Skin:    rash or lesions. GI:  No-   heartburn, indigestion, abdominal pain, nausea, vomiting, diarrhea,                 change in bowel habits, loss of appetite GU: dysuria, change in color of urine, no urgency or frequency.   flank pain. MS:   joint pain, stiffness, decreased range of motion, back pain. Neuro-     nothing unusual Psych:  change in mood or affect.  depression or anxiety.   memory loss.  OBJ- Physical Exam General- Alert, Oriented, Affect-appropriate, Distress- none  acute, not overweight Skin- rash-none, lesions- none, excoriation- none Lymphadenopathy- none Head- atraumatic            Eyes- Gross vision intact, PERRLA, conjunctivae and secretions clear            Ears- Hearing, canals-normal            Nose- Clear, no-Septal dev, mucus+, polyps, erosion, perforation             Throat- Mallampati III , mucosa clear , drainage- none, tonsils- atrophic Neck- flexible , trachea midline, no stridor , thyroid nl, carotid no bruit Chest - symmetrical excursion , unlabored           Heart/CV- RRR , no murmur , no gallop  , no rub, nl s1 s2                           - JVD- none , edema- none, stasis changes- none, varices- none           Lung- clear to P&A, wheeze- none, cough- none , dullness-none, rub- none  Chest wall-  Abd-  Br/ Gen/ Rectal- Not done, not indicated Extrem- cyanosis- none, clubbing, none, atrophy- none, strength- nl Neuro- grossly intact to observation

## 2016-04-28 NOTE — Assessment & Plan Note (Addendum)
Severe obstructive sleep apnea despite lack of obesity. We discussed treatment options and medical problems associated with untreated OSA Plan-new start CPAP 5-20

## 2016-04-28 NOTE — Patient Instructions (Signed)
Order- new DME, new CPAP,  Auto 5-20, mask of choice,  Humidifier, supplies, AirView    Dx OSA  Please call as needed

## 2016-05-13 DIAGNOSIS — G4733 Obstructive sleep apnea (adult) (pediatric): Secondary | ICD-10-CM | POA: Diagnosis not present

## 2016-06-12 DIAGNOSIS — G4733 Obstructive sleep apnea (adult) (pediatric): Secondary | ICD-10-CM | POA: Diagnosis not present

## 2016-06-30 ENCOUNTER — Encounter: Payer: Self-pay | Admitting: Family Medicine

## 2016-06-30 ENCOUNTER — Telehealth: Payer: Self-pay | Admitting: Family Medicine

## 2016-06-30 ENCOUNTER — Ambulatory Visit (INDEPENDENT_AMBULATORY_CARE_PROVIDER_SITE_OTHER): Payer: BLUE CROSS/BLUE SHIELD | Admitting: Family Medicine

## 2016-06-30 VITALS — BP 110/70 | HR 69 | Temp 98.0°F | Ht 72.0 in | Wt 198.8 lb

## 2016-06-30 DIAGNOSIS — R0789 Other chest pain: Secondary | ICD-10-CM | POA: Diagnosis not present

## 2016-06-30 DIAGNOSIS — Z131 Encounter for screening for diabetes mellitus: Secondary | ICD-10-CM

## 2016-06-30 DIAGNOSIS — E785 Hyperlipidemia, unspecified: Secondary | ICD-10-CM

## 2016-06-30 DIAGNOSIS — R5383 Other fatigue: Secondary | ICD-10-CM | POA: Diagnosis not present

## 2016-06-30 MED ORDER — CYCLOBENZAPRINE HCL 10 MG PO TABS: 10.0000 mg | ORAL_TABLET | Freq: Three times a day (TID) | ORAL | 1 refills | Status: DC | PRN

## 2016-06-30 NOTE — Progress Notes (Signed)
BP 110/70 (BP Location: Left Arm, Patient Position: Sitting, Cuff Size: Normal)   Pulse 69   Temp 98 F (36.7 C) (Oral)   Ht 6' (1.829 m)   Wt 198 lb 12.8 oz (90.2 kg)   BMI 26.96 kg/m    Subjective:    Patient ID: Andrew [name], male    DOB: 03/25/1971, 45 y.o.   MRN: [id number]  HPI: Andrew [name] is a 45 y.o. male presenting on 06/30/2016 for Thyroid Problem (rck thyroid, has been off meds since about April, not sure) and Cyst (uncomfortable feeling under left underarm)   HPI Fatigue Patient comes in today with complaints of fatigue and lack of energy. He says he is not having any shortness of breath like he was having before. He has gone and been diagnosed with sleep apnea and is on a CPAP for that but is only able to use it 3-4 hours a night before he pulls it off in his sleep. As a couple times when his morning all night he had the best sleep that he has had a long time. He denies any chest pain or shortness of breath outside of his axillary muscular pain that he has had for a month. He feels like his energy is just lacking and down. He would like to have his thyroid rechecked like he had previously.  Muscular chest wall pain Patient has had issues with recurrent left axillary chest wall muscular pain. He was seen for this previously and it improved with ibuprofen but has since returned. the pain is been constant for the past month. He has not tried ibuprofen again because he does not like taking pills. He is concerned about cardiac but has had a workup previously. He denies any shortness of breath or wheezing or difficulties with exertion. Patient is also coming in today for hyperlipidemia and diabetes lab testing. The pain is described more as a tightness under his left axilla. It does not radiate anywhere else.  Relevant past medical, surgical, family and social history reviewed and updated as indicated. Interim medical history since our last visit reviewed. Allergies  and medications reviewed and updated.  Review of Systems  Constitutional: Positive for fatigue. Negative for fever.  HENT: Negative for ear discharge and ear pain.   Eyes: Negative for discharge and visual disturbance.  Respiratory: Negative for chest tightness, shortness of breath and wheezing.   Cardiovascular: Positive for chest pain. Negative for leg swelling.  Gastrointestinal: Negative for abdominal pain, constipation and diarrhea.  Genitourinary: Negative for difficulty urinating.  Musculoskeletal: Negative for back pain and gait problem.  Skin: Negative for rash.  Neurological: Negative for dizziness, syncope, light-headedness and headaches.  All other systems reviewed and are negative.   Per HPI unless specifically indicated above     Medication List       Accurate as of 06/30/16  1:30 PM. Always use your most recent med list.          aspirin 325 MG tablet Take 325 mg by mouth daily. Reported on 12/21/2015   cyclobenzaprine 10 MG tablet Commonly known as:  FLEXERIL Take 1 tablet (10 mg total) by mouth 3 (three) times daily as needed for muscle spasms.   omeprazole 40 MG capsule Commonly known as:  PRILOSEC Take 1 capsule (40 mg total) by mouth daily.          Objective:    BP 110/70 (BP Location: Left Arm, Patient Position: Sitting, Cuff Size: Normal)   Pulse  69   Temp 98 F (36.7 C) (Oral)   Ht 6' (1.829 m)   Wt 198 lb 12.8 oz (90.2 kg)   BMI 26.96 kg/m   Wt Readings from Last 3 Encounters:  06/30/16 198 lb 12.8 oz (90.2 kg)  04/28/16 201 lb 3.2 oz (91.3 kg)  03/27/16 200 lb (90.7 kg)    Physical Exam  Constitutional: He is oriented to person, place, and time. He appears well-developed and well-nourished. No distress.  Eyes: Conjunctivae and EOM are normal. Pupils are equal, round, and reactive to light. Right eye exhibits no discharge. No scleral icterus.  Neck: Neck supple. No thyromegaly present.  Cardiovascular: Normal rate, regular rhythm,  normal heart sounds and intact distal pulses.   No murmur heard. Pulmonary/Chest: Effort normal and breath sounds normal. No respiratory distress. He has no wheezes.  Musculoskeletal: Normal range of motion. He exhibits tenderness (Reproducible tenderness under his left axilla.). He exhibits no edema.  Lymphadenopathy:    He has no cervical adenopathy.  Neurological: He is alert and oriented to person, place, and time. Coordination normal.  Skin: Skin is warm and dry. No rash noted. He is not diaphoretic.  Psychiatric: He has a normal mood and affect. His behavior is normal.  Nursing note and vitals reviewed.      Assessment & Plan:   Problem List Items Addressed This Visit      Other   Hyperlipidemia   Relevant Orders   Lipid panel    Other Visit Diagnoses    Other fatigue    -  Primary   He does have sleep apnea and has been increase in the usage of his CPAP and getting used to it but is only using it between 3 and 4 hours a night currently   Relevant Orders   CMP14+EGFR   CBC with Differential/Platelet   TSH   VITAMIN D 25 Hydroxy (Vit-D Deficiency, Fractures)   Diabetes mellitus screening       Relevant Orders   CMP14+EGFR   Muscular chest pain       We'll give muscle relaxer and see if that helps   Relevant Medications   cyclobenzaprine (FLEXERIL) 10 MG tablet       Follow up plan: No Follow-up on file.  Counseling provided for all of the vaccine components Orders Placed This Encounter  Procedures  . CMP14+EGFR  . CBC with Differential/Platelet  . Lipid panel  . TSH  . VITAMIN D 25 Hydroxy (Vit-D Deficiency, Fractures)    Caryl Pina, MD [facility] 06/30/2016, 1:30 PM

## 2016-06-30 NOTE — Telephone Encounter (Signed)
Patient states that he is starting to feel tired again. Appointment given for today at 1:10 with Dettinger.

## 2016-07-01 LAB — CMP14+EGFR
ALBUMIN: 4.4 g/dL (ref 3.5–5.5)
ALK PHOS: 87 IU/L (ref 39–117)
ALT: 21 IU/L (ref 0–44)
AST: 15 IU/L (ref 0–40)
Albumin/Globulin Ratio: 1.6 (ref 1.2–2.2)
BILIRUBIN TOTAL: 0.3 mg/dL (ref 0.0–1.2)
BUN / CREAT RATIO: 11 (ref 9–20)
BUN: 10 mg/dL (ref 6–24)
CHLORIDE: 96 mmol/L (ref 96–106)
CO2: 24 mmol/L (ref 18–29)
Calcium: 9.3 mg/dL (ref 8.7–10.2)
Creatinine, Ser: 0.92 mg/dL (ref 0.76–1.27)
GFR calc Af Amer: 117 mL/min/{1.73_m2} (ref >59)
GFR calc non Af Amer: 101 mL/min/{1.73_m2} (ref >59)
Globulin, Total: 2.7 g/dL (ref 1.5–4.5)
Glucose: 88 mg/dL (ref 65–99)
Potassium: 4 mmol/L (ref 3.5–5.2)
SODIUM: 138 mmol/L (ref 134–144)
Total Protein: 7.1 g/dL (ref 6.0–8.5)

## 2016-07-01 LAB — LIPID PANEL
CHOL/HDL RATIO: 5 ratio (ref 0.0–5.0)
Cholesterol, Total: 171 mg/dL (ref 100–199)
HDL: 34 mg/dL — ABNORMAL LOW (ref >39)
LDL Calculated: 91 mg/dL (ref 0–99)
TRIGLYCERIDES: 229 mg/dL — AB (ref 0–149)
VLDL Cholesterol Cal: 46 mg/dL — ABNORMAL HIGH (ref 5–40)

## 2016-07-01 LAB — CBC WITH DIFFERENTIAL/PLATELET
BASOS ABS: 0 10*3/uL (ref 0.0–0.2)
Basos: 0 %
EOS (ABSOLUTE): 0.3 10*3/uL (ref 0.0–0.4)
Eos: 4 %
Hematocrit: 53 % — ABNORMAL HIGH (ref 37.5–51.0)
Hemoglobin: 18.3 g/dL — ABNORMAL HIGH (ref 12.6–17.7)
Immature Grans (Abs): 0 10*3/uL (ref 0.0–0.1)
Immature Granulocytes: 0 %
LYMPHS ABS: 2 10*3/uL (ref 0.7–3.1)
Lymphs: 26 %
MCH: 33.5 pg — ABNORMAL HIGH (ref 26.6–33.0)
MCHC: 34.5 g/dL (ref 31.5–35.7)
MCV: 97 fL (ref 79–97)
MONOCYTES: 8 %
MONOS ABS: 0.6 10*3/uL (ref 0.1–0.9)
Neutrophils Absolute: 4.9 10*3/uL (ref 1.4–7.0)
Neutrophils: 62 %
Platelets: 208 10*3/uL (ref 150–379)
RBC: 5.47 x10E6/uL (ref 4.14–5.80)
RDW: 13.3 % (ref 12.3–15.4)
WBC: 7.8 10*3/uL (ref 3.4–10.8)

## 2016-07-01 LAB — TSH: TSH: 2.22 u[IU]/mL (ref 0.450–4.500)

## 2016-07-01 LAB — VITAMIN D 25 HYDROXY (VIT D DEFICIENCY, FRACTURES): Vit D, 25-Hydroxy: 36.1 ng/mL (ref 30.0–100.0)

## 2016-07-02 ENCOUNTER — Telehealth: Payer: Self-pay | Admitting: Family Medicine

## 2016-07-04 NOTE — Telephone Encounter (Signed)
Will call pt once labs are back

## 2016-07-13 DIAGNOSIS — G4733 Obstructive sleep apnea (adult) (pediatric): Secondary | ICD-10-CM | POA: Diagnosis not present

## 2016-08-04 ENCOUNTER — Ambulatory Visit (INDEPENDENT_AMBULATORY_CARE_PROVIDER_SITE_OTHER): Payer: BLUE CROSS/BLUE SHIELD | Admitting: Internal Medicine

## 2016-08-04 ENCOUNTER — Encounter: Payer: Self-pay | Admitting: Internal Medicine

## 2016-08-04 VITALS — BP 118/72 | HR 83 | Ht 72.0 in | Wt 202.0 lb

## 2016-08-04 DIAGNOSIS — G4733 Obstructive sleep apnea (adult) (pediatric): Secondary | ICD-10-CM

## 2016-08-04 NOTE — Patient Instructions (Signed)
Order- DME [facility]- please change auto titration range to 5-12, please work with patient on mask of choice. He may prefer nasal pillows with chin strap. Continue humidifier, supplies, AirView    Dx OSA  Ok to use borrowed CPAP machine when away from home, set on 10 cwp. You can ask [facility] to check its filters, supplies, mask etc and ask how you can adjust the RAMP feature so it builds pressure gradually.

## 2016-08-04 NOTE — Progress Notes (Signed)
01/22/2016-45 year old male for smoker referred courtesy of Dr Museum/gallery exhibitions officerDettinger; never had sleep study. Pt stops breathing and snoring. Medical history of hypothyroid and GERD Code friend has told him loud breathing and witnessed apneas. He wakes is tired as he went to bed. Little aware of waking after sleep onset. Caffeine use-4 soft drinks per day. No ENT surgery. No active lung disease. Both parents use CPAP. Works as a Chartered certified accountantmachinist  04/28/2016-45 year old male former smoker followed for OSA complicated by GERD NPSG 03/27/16-AHI 40.1/hour, desaturation to 85%, CPAP titration to 9, body weight 200 pounds FOLLOWS FOR: Review sleep study from 03/27/16 with patient. Sleep study discussed in detail and treatment options reviewed. He primarily wants to wake up in the morning feeling rested and is willing to try CPAP first.  08/04/2016-45 year old male former smoker followed for OSA, complicated by GERD CPAP auto 5-20/[facility] > to 5-12 this visit FOLLOWS FOR: DME [facility]; Pt states he wears CPAP every night for about 5 hours; hard to put back on once he wakes through the night. DL attached.  Download shows adequate for our compliance at 73%, pressure range 5-20 auto, great control with AHI 1.6/hour. Using a fullface mask. Comfort and air leak reduce compliance-discussed. Daytime sleepiness is improved. Snoring is prevented. He uses a borrowed machine when he is out of town. This isn't seen by his download so actual uses better than reported.  ROS-see HPI   Negative unless "+" Constitutional:    weight loss, night sweats, fevers, chills,  fatigue, lassitude. HEENT:    headaches, difficulty swallowing, tooth/dental problems, sore throat,       sneezing, itching, ear ache, nasal congestion, post nasal drip, snoring CV:    chest pain, orthopnea, PND, swelling in lower extremities, anasarca,                                                   dizziness, palpitations Resp:   shortness of breath with exertion or at rest.                 productive cough,   non-productive cough, coughing up of blood.              change in color of mucus.  wheezing.   Skin:    rash or lesions. GI:  No-   heartburn, indigestion, abdominal pain, nausea, vomiting, diarrhea,                 change in bowel habits, loss of appetite GU: dysuria, change in color of urine, no urgency or frequency.   flank pain. MS:   joint pain, stiffness, decreased range of motion, back pain. Neuro-     nothing unusual Psych:  change in mood or affect.  depression or anxiety.   memory loss.  OBJ- Physical Exam General- Alert, Oriented, Affect-appropriate, Distress- none acute, not overweight Skin- rash-none, lesions- none, excoriation- none Lymphadenopathy- none Head- atraumatic            Eyes- Gross vision intact, PERRLA, conjunctivae and secretions clear            Ears- Hearing, canals-normal            Nose- Clear, no-Septal dev, mucus+, polyps, erosion, perforation             Throat- Mallampati III , mucosa clear , drainage- none, tonsils- atrophic Neck-  flexible , trachea midline, no stridor , thyroid nl, carotid no bruit Chest - symmetrical excursion , unlabored           Heart/CV- RRR , no murmur , no gallop  , no rub, nl s1 s2                           - JVD- none , edema- none, stasis changes- none, varices- none           Lung- clear to P&A, wheeze- none, cough- none , dullness-none, rub- none           Chest wall-  Abd-  Br/ Gen/ Rectal- Not done, not indicated Extrem- cyanosis- none, clubbing, none, atrophy- none, strength- nl Neuro- grossly intact to observation

## 2016-08-07 NOTE — Assessment & Plan Note (Signed)
We discussed compliance goals. Reviewed comfort issues. Plan-he will work with his DME company on mask choice/fit. To reduce leak we are going to try reducing his pressure range to 5-12

## 2016-08-13 DIAGNOSIS — G4733 Obstructive sleep apnea (adult) (pediatric): Secondary | ICD-10-CM | POA: Diagnosis not present

## 2016-08-15 ENCOUNTER — Encounter: Payer: Self-pay | Admitting: Internal Medicine

## 2016-11-23 IMAGING — CR DG RIBS W/ CHEST 3+V*L*
2 series · 2 of 2 positions shown · non-contrast
Comparison: PA and lateral chest 05/10/2004.

CLINICAL DATA: Left chest pain for 2 weeks. No known injury.
Initial encounter.

EXAM:
LEFT RIBS AND CHEST - 3+ VIEW

[view not recorded (1 of 2)]
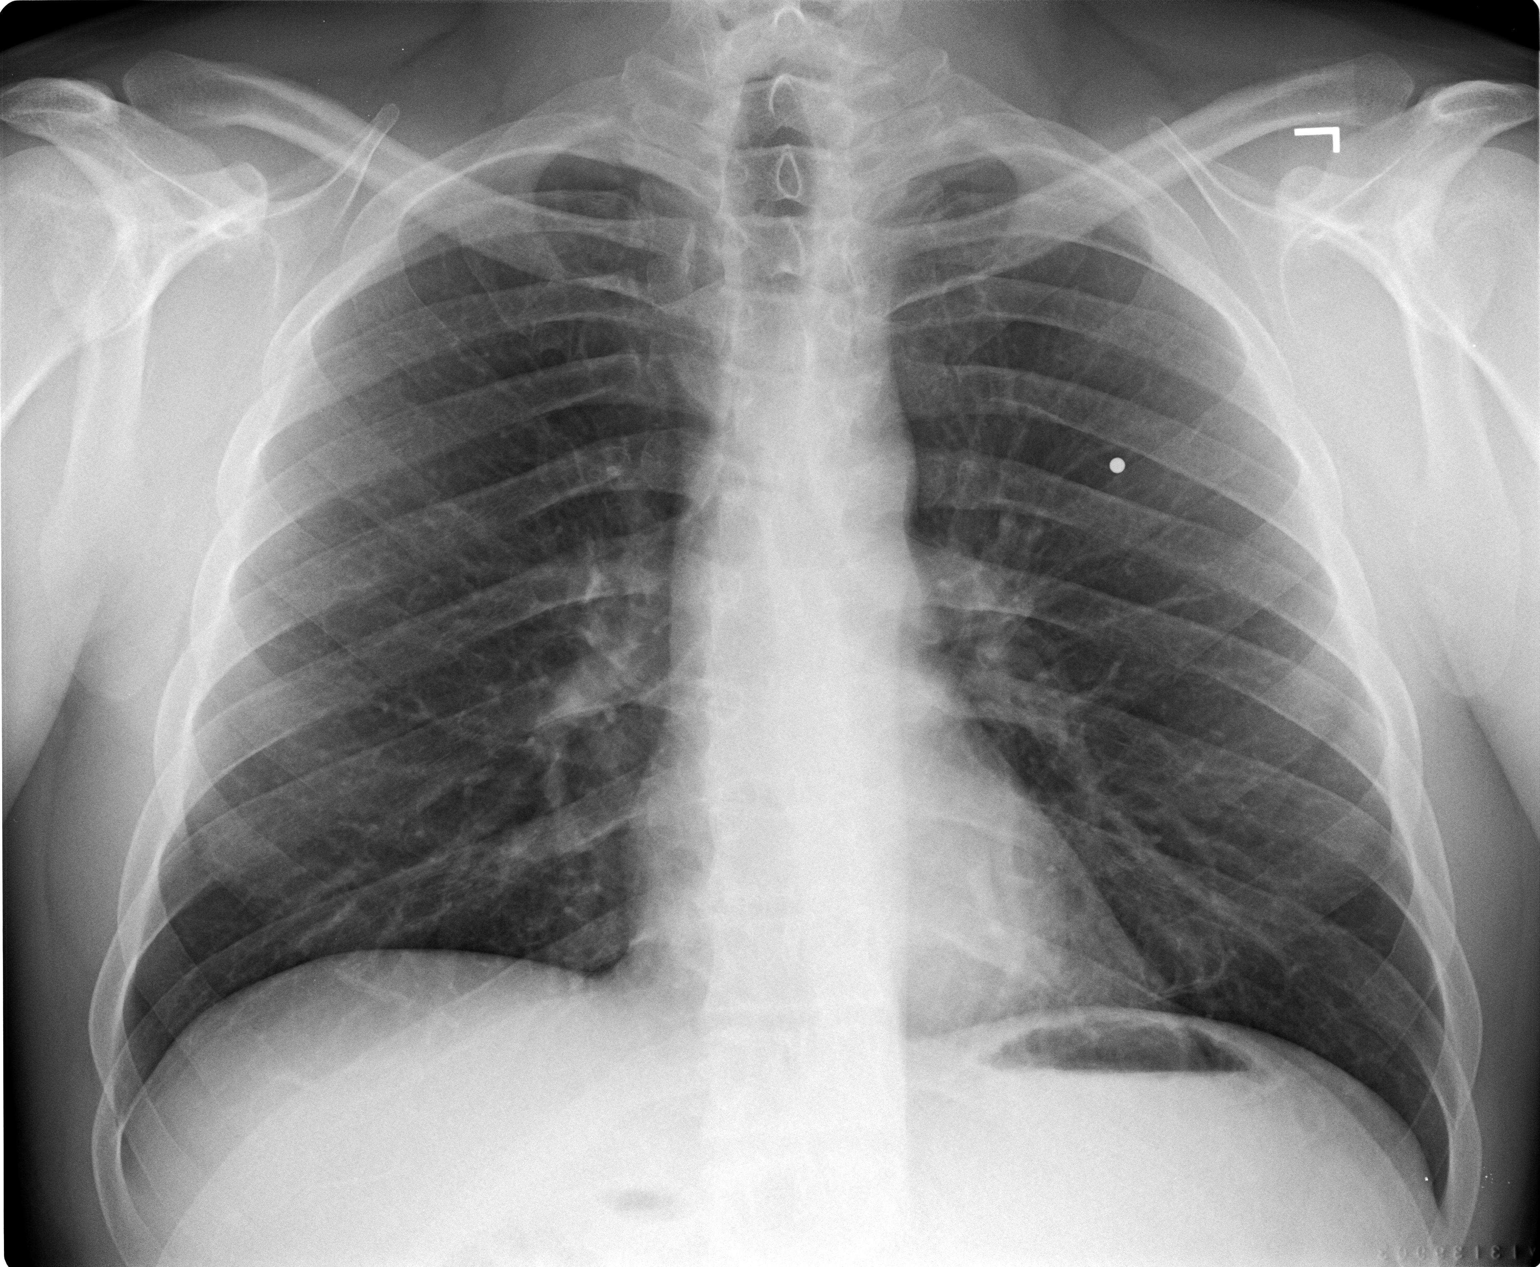

[view not recorded (2 of 2)]
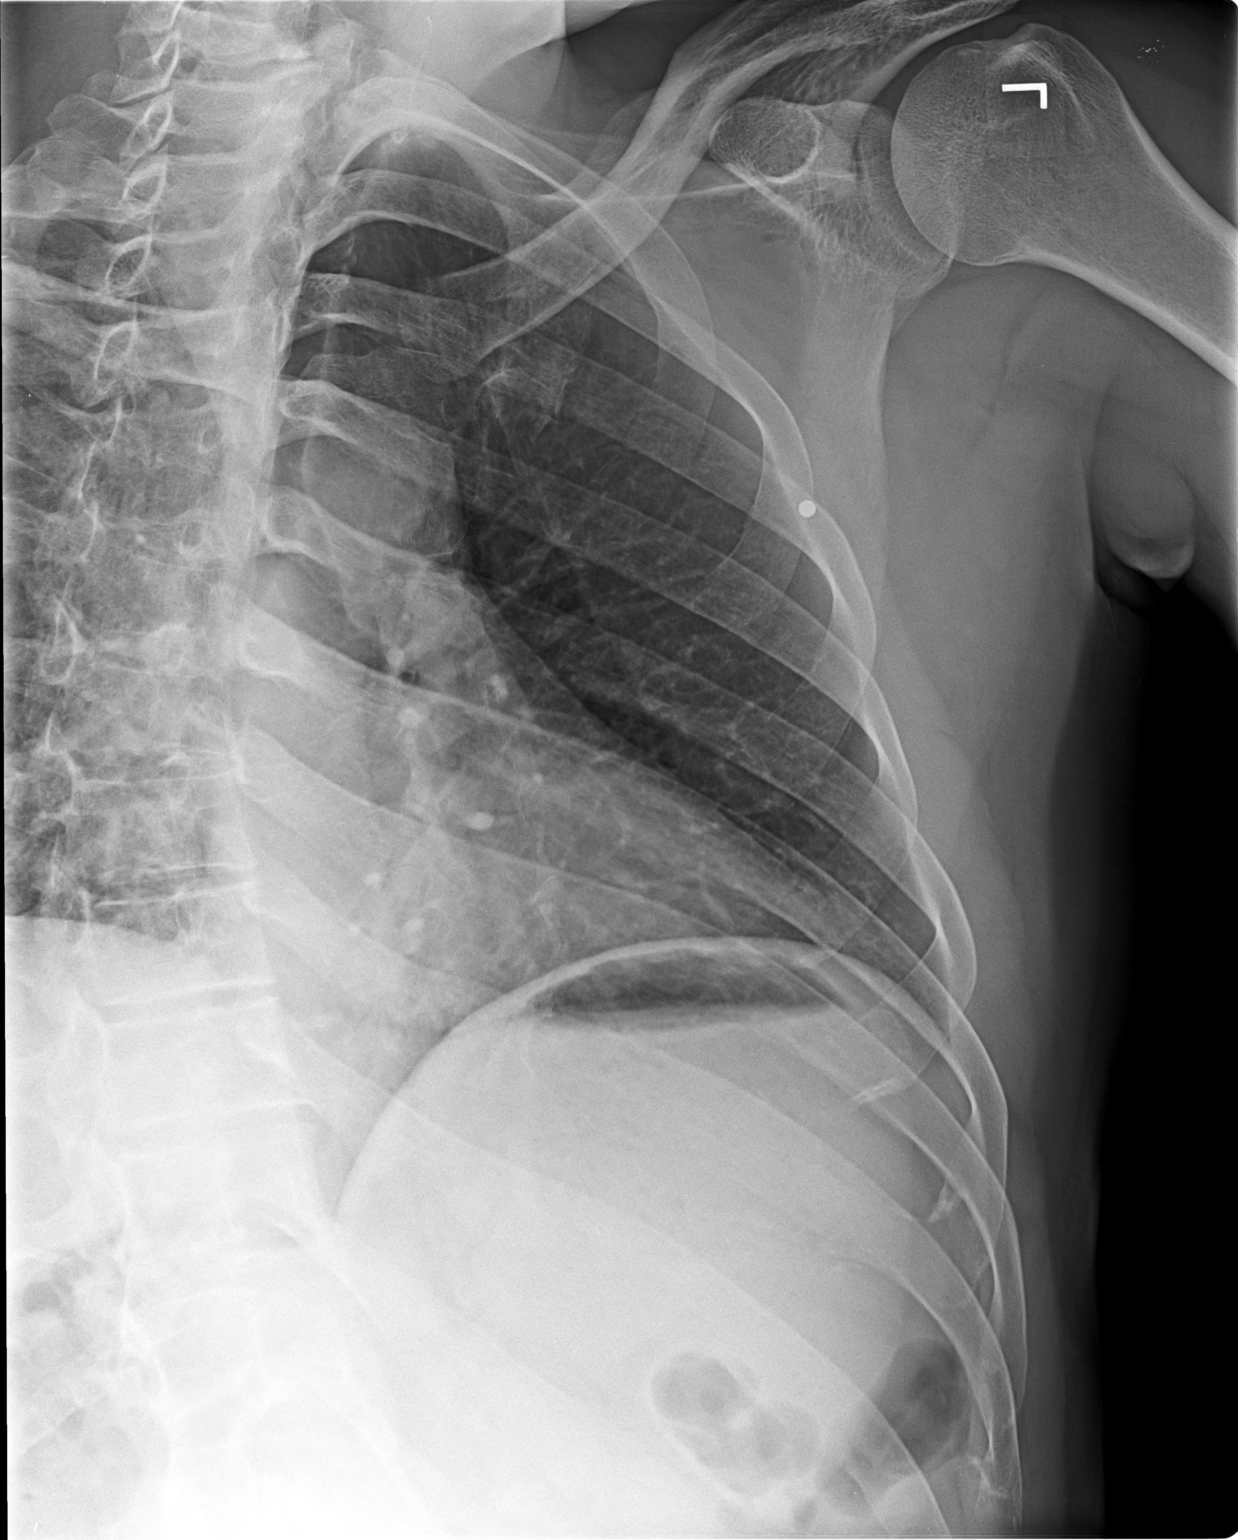

[2 of 2 positions shown; findings below may reference images not displayed]

FINDINGS: The lungs are clear. Heart size is normal. No pneumothorax or
pleural effusion. A marker is placed in the region of concern over
the left upper chest. Underlying bony and soft tissues appear
normal. No fracture is identified.
IMPRESSION: Normal examination.  No finding to explain the patient's symptoms.

## 2016-12-05 ENCOUNTER — Encounter: Payer: Self-pay | Admitting: Family Medicine

## 2016-12-05 ENCOUNTER — Ambulatory Visit (INDEPENDENT_AMBULATORY_CARE_PROVIDER_SITE_OTHER): Payer: BLUE CROSS/BLUE SHIELD

## 2016-12-05 ENCOUNTER — Ambulatory Visit (INDEPENDENT_AMBULATORY_CARE_PROVIDER_SITE_OTHER): Payer: BLUE CROSS/BLUE SHIELD | Admitting: Family Medicine

## 2016-12-05 VITALS — BP 110/72 | HR 68 | Temp 97.8°F | Ht 72.0 in | Wt 205.4 lb

## 2016-12-05 DIAGNOSIS — K219 Gastro-esophageal reflux disease without esophagitis: Secondary | ICD-10-CM

## 2016-12-05 DIAGNOSIS — S20212A Contusion of left front wall of thorax, initial encounter: Secondary | ICD-10-CM | POA: Diagnosis not present

## 2016-12-05 DIAGNOSIS — S2232XA Fracture of one rib, left side, initial encounter for closed fracture: Secondary | ICD-10-CM

## 2016-12-05 MED ORDER — CYCLOBENZAPRINE HCL 10 MG PO TABS: 10.0000 mg | ORAL_TABLET | Freq: Three times a day (TID) | ORAL | 1 refills | Status: DC | PRN

## 2016-12-05 MED ORDER — OMEPRAZOLE 40 MG PO CPDR: 40.0000 mg | DELAYED_RELEASE_CAPSULE | Freq: Every day | ORAL | 5 refills | Status: DC
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
## 2017-01-18 ENCOUNTER — Telehealth: Payer: Self-pay | Admitting: Internal Medicine

## 2017-01-18 NOTE — Telephone Encounter (Signed)
Order was placed for pressure change in 07/2016. I have called Melissa at [facility]. She is going to look into this and call us back. I called and spoke with pt and made him aware of this. Will await Melissa's call back.

## 2017-01-20 NOTE — Telephone Encounter (Signed)
Andrew [name] called this morning and she stated that the pts pressure was changed via airview and they did call and leave a VM to make the pt aware. Nothing further is needed.

## 2017-01-25 ENCOUNTER — Encounter: Payer: Self-pay | Admitting: Family Medicine

## 2017-01-25 ENCOUNTER — Ambulatory Visit (INDEPENDENT_AMBULATORY_CARE_PROVIDER_SITE_OTHER): Payer: BLUE CROSS/BLUE SHIELD | Admitting: Family Medicine

## 2017-01-25 VITALS — BP 113/75 | HR 86 | Temp 97.6°F | Ht 73.83 in | Wt 206.0 lb

## 2017-01-25 DIAGNOSIS — R6882 Decreased libido: Secondary | ICD-10-CM | POA: Diagnosis not present

## 2017-01-25 DIAGNOSIS — J101 Influenza due to other identified influenza virus with other respiratory manifestations: Secondary | ICD-10-CM | POA: Diagnosis not present

## 2017-01-25 DIAGNOSIS — K219 Gastro-esophageal reflux disease without esophagitis: Secondary | ICD-10-CM | POA: Diagnosis not present

## 2017-01-25 LAB — VERITOR FLU A/B WAIVED
INFLUENZA B: NEGATIVE
Influenza A: POSITIVE — AB

## 2017-01-25 MED ORDER — OSELTAMIVIR PHOSPHATE 75 MG PO CAPS: 75.0000 mg | ORAL_CAPSULE | Freq: Two times a day (BID) | ORAL | 0 refills | Status: DC

## 2017-01-25 NOTE — Progress Notes (Signed)
   HPI  Patient presents today for follow-up chronic medical conditions as well as same-day appointment for body aches.  Patient has several flu positive family members He's had one day of body aches, fever measured to 101, and extreme fatigue. He denies any severe dyspnea, chills, or sweats. He also has 3 days of right-sided sharp chest pain with deep inspiration, no exertional symptoms, no history of heart disease.  GERD Sometimes has body aches with Prilosec, going on for several months.  Low libido and fatigue and erectile dysfunction for about 6 months. Patient is interested to see if his thyroid and testosterone are normal.   PMH: Smoking status noted ROS: Per HPI  Objective: BP 113/75   Pulse 86   Temp 97.6 F (36.4 C) (Oral)   Ht 6' 1.83" (1.875 m)   Wt 206 lb (93.4 kg)   BMI 26.57 kg/m  Gen: NAD, alert, cooperative with exam HEENT: NCAT, pharynx moist and clear, nares with swollen turbinates on the left, TMs normal bilaterally Chest wall: No tenderness to palpation of the right sternal border CV: RRR, good S1/S2, no murmur Resp: CTABL, no wheezes, non-labored Abd: SNTND, BS present, no guarding or organomegaly Ext: No edema, warm Neuro: Alert and oriented, No gross deficits  Assessment and plan:  # Influenza Treat with Tamiflu, within the 48-hour window Discussed supportive care and usual course of illness Note written for work  # Decreased libido Checking testosterone and TSH, recommended if confirms 2 times decreased testosterone would refer to endocrinology for treatment  # GERD Recommended lower dose of PPI, over-the-counter Nexium, also could alternate or just change over to Pepcid.    Orders Placed This Encounter  Procedures  . Veritor Flu A/B Waived    Order Specific Question:   Source    Answer:   nasal  . Testosterone  . TSH  . CBC with Differential/Platelet    Meds ordered this encounter  Medications  . oseltamivir (TAMIFLU) 75 MG  capsule    Sig: Take 1 capsule (75 mg total) by mouth 2 (two) times daily.    Dispense:  10 capsule    Refill:  0    Murtis SinkSam Bradshaw, MD [facility] 01/25/2017, 8:31 AM

## 2017-01-25 NOTE — Patient Instructions (Addendum)
Great to meet you!  Try pepdic ( generic is ok) twice daily for reflux, or you can try lower dose nexium or prilosec over the counter.   We will call with labs within 1 week   Influenza, Adult Influenza, more commonly known as "the flu," is a viral infection that primarily affects the respiratory tract. The respiratory tract includes organs that help you breathe, such as the lungs, nose, and throat. The flu causes many common cold symptoms, as well as a high fever and body aches. The flu spreads easily from person to person (is contagious). Getting a flu shot (influenza vaccination) every year is the best way to prevent influenza. What are the causes? Influenza is caused by a virus. You can catch the virus by:  Breathing in droplets from an infected person's cough or sneeze.  Touching something that was recently contaminated with the virus and then touching your mouth, nose, or eyes. What increases the risk? The following factors may make you more likely to get the flu:  Not cleaning your hands frequently with soap and water or alcohol-based hand sanitizer.  Having close contact with many people during cold and flu season.  Touching your mouth, eyes, or nose without washing or sanitizing your hands first.  Not drinking enough fluids or not eating a healthy diet.  Not getting enough sleep or exercise.  Being under a high amount of stress.  Not getting a yearly (annual) flu shot. You may be at a higher risk of complications from the flu, such as a severe lung infection (pneumonia), if you:  Are over the age of 20.  Are pregnant.  Have a weakened disease-fighting system (immune system). You may have a weakened immune system if you:  Have HIV or AIDS.  Are undergoing chemotherapy.  Aretaking medicines that reduce the activity of (suppress) the immune system.  Have a long-term (chronic) illness, such as heart disease, kidney disease, diabetes, or lung disease.  Have a  liver disorder.  Are obese.  Have anemia. What are the signs or symptoms? Symptoms of this condition typically last 4-10 days and may include:  Fever.  Chills.  Headache, body aches, or muscle aches.  Sore throat.  Cough.  Runny or congested nose.  Chest discomfort and cough.  Poor appetite.  Weakness or tiredness (fatigue).  Dizziness.  Nausea or vomiting. How is this diagnosed? This condition may be diagnosed based on your medical history and a physical exam. Your health care provider may do a nose or throat swab test to confirm the diagnosis. How is this treated? If influenza is detected early, you can be treated with antiviral medicine that can reduce the length of your illness and the severity of your symptoms. This medicine may be given by mouth (orally) or through an IV tube that is inserted in one of your veins. The goal of treatment is to relieve symptoms by taking care of yourself at home. This may include taking over-the-counter medicines, drinking plenty of fluids, and adding humidity to the air in your home. In some cases, influenza goes away on its own. Severe influenza or complications from influenza may be treated in a hospital. Follow these instructions at home:  Take over-the-counter and prescription medicines only as told by your health care provider.  Use a cool mist humidifier to add humidity to the air in your home. This can make breathing easier.  Rest as needed.  Drink enough fluid to keep your urine clear or pale yellow.  Cover your mouth and nose when you cough or sneeze.  Wash your hands with soap and water often, especially after you cough or sneeze. If soap and water are not available, use hand sanitizer.  Stay home from work or school as told by your health care provider. Unless you are visiting your health care provider, try to avoid leaving home until your fever has been gone for 24 hours without the use of medicine.  Keep all  follow-up visits as told by your health care provider. This is important. How is this prevented?  Getting an annual flu shot is the best way to avoid getting the flu. You may get the flu shot in late summer, fall, or winter. Ask your health care provider when you should get your flu shot.  Wash your hands often or use hand sanitizer often.  Avoid contact with people who are sick during cold and flu season.  Eat a healthy diet, drink plenty of fluids, get enough sleep, and exercise regularly. Contact a health care provider if:  You develop new symptoms.  You have:  Chest pain.  Diarrhea.  A fever.  Your cough gets worse.  You produce more mucus.  You feel nauseous or you vomit. Get help right away if:  You develop shortness of breath or difficulty breathing.  Your skin or nails turn a bluish color.  You have severe pain or stiffness in your neck.  You develop a sudden headache or sudden pain in your face or ear.  You cannot stop vomiting. This information is not intended to replace advice given to you by your health care provider. Make sure you discuss any questions you have with your health care provider. Document Released: 11/11/2000 Document Revised: 04/21/2016 Document Reviewed: 09/08/2015 Elsevier Interactive Patient Education  2017 [employer]

## 2017-01-26 LAB — TESTOSTERONE: Testosterone: 267 ng/dL (ref 264–916)

## 2017-01-26 LAB — CBC WITH DIFFERENTIAL/PLATELET
BASOS ABS: 0 10*3/uL (ref 0.0–0.2)
BASOS: 1 %
EOS (ABSOLUTE): 0.2 10*3/uL (ref 0.0–0.4)
Eos: 3 %
Hematocrit: 46.8 % (ref 37.5–51.0)
Hemoglobin: 15.8 g/dL (ref 13.0–17.7)
IMMATURE GRANULOCYTES: 0 %
Immature Grans (Abs): 0 10*3/uL (ref 0.0–0.1)
Lymphocytes Absolute: 1.4 10*3/uL (ref 0.7–3.1)
Lymphs: 27 %
MCH: 31.3 pg (ref 26.6–33.0)
MCHC: 33.8 g/dL (ref 31.5–35.7)
MCV: 93 fL (ref 79–97)
MONOS ABS: 0.7 10*3/uL (ref 0.1–0.9)
Monocytes: 14 %
NEUTROS PCT: 55 %
Neutrophils Absolute: 2.8 10*3/uL (ref 1.4–7.0)
PLATELETS: 175 10*3/uL (ref 150–379)
RBC: 5.05 x10E6/uL (ref 4.14–5.80)
RDW: 13.5 % (ref 12.3–15.4)
WBC: 5 10*3/uL (ref 3.4–10.8)

## 2017-01-26 LAB — TSH: TSH: 2.22 u[IU]/mL (ref 0.450–4.500)

## 2017-02-28 ENCOUNTER — Encounter: Payer: Self-pay | Admitting: Internal Medicine

## 2017-03-02 ENCOUNTER — Encounter: Payer: Self-pay | Admitting: Internal Medicine

## 2017-03-02 ENCOUNTER — Ambulatory Visit (INDEPENDENT_AMBULATORY_CARE_PROVIDER_SITE_OTHER): Payer: BLUE CROSS/BLUE SHIELD | Admitting: Internal Medicine

## 2017-03-02 DIAGNOSIS — G4733 Obstructive sleep apnea (adult) (pediatric): Secondary | ICD-10-CM

## 2017-03-02 MED ORDER — ZALEPLON 5 MG PO CAPS: 5.0000 mg | ORAL_CAPSULE | Freq: Every evening | ORAL | 5 refills | Status: DC | PRN

## 2017-03-02 NOTE — Patient Instructions (Addendum)
Script printed for sonata sleep medicine to use for insomnia if needed  Order- DME [facility]/ Michell Heinrich    Continue CPAP auto 5-12, mask of choice, supplies, humidifier, AirView   Dx OSA       He needs replacement mask- please help refit for comfort. He may do better with nasal pillows.   Please call as needed

## 2017-03-02 NOTE — Progress Notes (Signed)
HPI- male former smoker followed for OSA, complicated by GERD NPSG 03/27/16-AHI 40.1/hour, desaturation to 85%, CPAP titration to 9, body weight 200 pounds  --------------------------------------------------------------------  08/04/2016-46 year old male former smoker followed for OSA, complicated by GERD CPAP auto 5-20/Advanced > to 5-12 this visit FOLLOWS FOR: DME [facility]; Pt states he wears CPAP every night for about 5 hours; hard to put back on once he wakes through the night. DL attached.  Download shows adequate for our compliance at 73%, pressure range 5-20 auto, great control with AHI 1.6/hour. Using a fullface mask. Comfort and air leak reduce compliance-discussed. Daytime sleepiness is improved. Snoring is prevented. He uses a borrowed machine when he is out of town. This isn't seen by his download so actual uses better than reported.  03/02/2017-46 year old male former smoker followed for OSA, complicated by GERD CPAP 5-12 auto/Advanced FOLLOWS FOR:DME: [facility] Pt wears CPAP nightly. Pressure to be discussed with  patient and no new supplies needed at this time. DL attached.  Does ask about changing masks. Previously like nasal pillows. Currently having some trouble falling asleep. Download-compliance 4 hours 57%, AHI 2.0/hour. We discussed compliance goals and importance of comfort.  ROS-see HPI   Negative unless "+" Constitutional:    weight loss, night sweats, fevers, chills,  + fatigue, lassitude. HEENT:    headaches, difficulty swallowing, tooth/dental problems, sore throat,       sneezing, itching, ear ache, nasal congestion, post nasal drip, snoring CV:    chest pain, orthopnea, PND, swelling in lower extremities, anasarca,                                                   dizziness, palpitations Resp:   shortness of breath with exertion or at rest.                productive cough,   non-productive cough, coughing up of blood.              change in color of mucus.  wheezing.    Skin:    rash or lesions. GI:  No-   heartburn, indigestion, abdominal pain, nausea, vomiting, diarrhea,                 change in bowel habits, loss of appetite GU: dysuria, change in color of urine, no urgency or frequency.   flank pain. MS:   joint pain, stiffness, decreased range of motion, back pain. Neuro-     nothing unusual Psych:  change in mood or affect.  depression or anxiety.   memory loss.  OBJ- Physical Exam General- Alert, Oriented, Affect-appropriate, Distress- none acute, not overweight Skin- rash-none, lesions- none, excoriation- none Lymphadenopathy- none Head- atraumatic            Eyes- Gross vision intact, PERRLA, conjunctivae and secretions clear            Ears- Hearing, canals-normal            Nose- Clear, no-Septal dev, mucus+, polyps, erosion, perforation             Throat- Mallampati III-IV , mucosa clear , drainage- none, tonsils- atrophic Neck- flexible , trachea midline, no stridor , thyroid nl, carotid no bruit Chest - symmetrical excursion , unlabored           Heart/CV- RRR , no murmur , no gallop  ,  no rub, nl s1 s2                           - JVD- none , edema- none, stasis changes- none, varices- none           Lung- clear to P&A, wheeze- none, cough- none , dullness-none, rub- none           Chest wall-  Abd-  Br/ Gen/ Rectal- Not done, not indicated Extrem- cyanosis- none, clubbing, none, atrophy- none, strength- nl Neuro- grossly intact to observation

## 2017-03-05 NOTE — Assessment & Plan Note (Signed)
We need to improve mask comfort in order to improve his compliance as discussed with him. Pressure range seems good. Plan-contact DME about mask change/refitting. Try Sonata to help initiate sleep. Educated on basic sleep hygiene

## 2017-03-16 ENCOUNTER — Telehealth: Payer: Self-pay | Admitting: Family Medicine

## 2017-03-16 NOTE — Telephone Encounter (Signed)
appt made to be seen  

## 2017-03-17 ENCOUNTER — Encounter: Payer: Self-pay | Admitting: Family Medicine

## 2017-03-17 ENCOUNTER — Ambulatory Visit (INDEPENDENT_AMBULATORY_CARE_PROVIDER_SITE_OTHER): Payer: BLUE CROSS/BLUE SHIELD | Admitting: Family Medicine

## 2017-03-17 VITALS — BP 117/76 | HR 64 | Temp 97.8°F | Ht 72.0 in | Wt 204.2 lb

## 2017-03-17 DIAGNOSIS — N529 Male erectile dysfunction, unspecified: Secondary | ICD-10-CM | POA: Diagnosis not present

## 2017-03-17 MED ORDER — TADALAFIL 20 MG PO TABS: 10.0000 mg | ORAL_TABLET | ORAL | 11 refills | Status: DC | PRN

## 2017-03-17 NOTE — Progress Notes (Signed)
   HPI  Patient presents today with erectile dysfunction.  Patient discussed this with the last visit, he described it more as a decrease extra. Turns out his sex drive is normal. He has difficulty getting an erection at times and almost on every occurrence has difficulty maintaining the erection.   Tried 50 mg viagra after drinking alcohol with some improvement.   PMH: Smoking status noted ROS: Per HPI  Objective: BP 117/76   Pulse 64   Temp 97.8 F (36.6 C) (Oral)   Ht 6' (1.829 m)   Wt 204 lb 3.2 oz (92.6 kg)   BMI 27.69 kg/m  Gen: NAD, alert, cooperative with exam HEENT: NCAT, EOMI, PERRL CV: RRR, good S1/S2, no murmur Resp: CTABL, no wheezes, non-labored Abd: SNTND, BS present, no guarding or organomegaly Ext: No edema, warm Neuro: Alert and oriented, No gross deficits  Assessment and plan:  # Erectile dysfunction Treat with Cialis, or miliary preference. Discussed with patient possible some improvement with lifestyle modification, working on general fitness and avoiding alcohol before sex. Patient will call in if not improved within 1-2 weeks for urology referral.    Meds ordered this encounter  Medications  . tadalafil (CIALIS) 20 MG tablet    Sig: Take 0.5-1 tablets (10-20 mg total) by mouth every other day as needed for erectile dysfunction.    Dispense:  5 tablet    Refill:  11    Murtis Sink, MD [place] [facility] 03/17/2017, 5:12 PM

## 2017-03-17 NOTE — Patient Instructions (Signed)
Great to see you!  Try cialis 30 minutes before you need it, let me know if it is not effective and I will refer to urology.

## 2017-03-22 ENCOUNTER — Telehealth: Payer: Self-pay | Admitting: Family Medicine

## 2017-03-22 NOTE — Telephone Encounter (Signed)
lmtcb

## 2017-03-23 ENCOUNTER — Telehealth: Payer: Self-pay

## 2017-03-23 ENCOUNTER — Telehealth: Payer: Self-pay | Admitting: Family Medicine

## 2017-03-23 NOTE — Telephone Encounter (Signed)
It looks like patient had previously are tried Viagra, read Dr. Felipa Emory note in his last visit.

## 2017-03-24 NOTE — Telephone Encounter (Signed)
Patient said he was unable to get his prescription for Cialis.  I contacted [facility] and they said it required a prior authorization which we sent back and the prior auth was denied.  Patient would like to know what else can be done or can something else be prescribed?  He is aware you are out of the office until Monday.

## 2017-03-27 MED ORDER — SILDENAFIL CITRATE 20 MG PO TABS: ORAL_TABLET | ORAL | 3 refills | Status: DC

## 2017-03-27 NOTE — Telephone Encounter (Signed)
Generic sildenafil given. Best prices seem to be at the [employer] so I have printed the Rx so that he can shop around.   This is not covered by insurance and typically costs about 1 dollar a pill.   Murtis Sink, MD [facility] 03/27/2017, 7:39 AM

## 2017-03-27 NOTE — Telephone Encounter (Signed)
Pt notified of RX 

## 2017-03-27 NOTE — Addendum Note (Signed)
Addended by: Elenora Gamma on: 03/27/2017 07:40 AM   Modules accepted: Orders

## 2017-04-19 DIAGNOSIS — G4733 Obstructive sleep apnea (adult) (pediatric): Secondary | ICD-10-CM | POA: Diagnosis not present

## 2017-04-26 ENCOUNTER — Other Ambulatory Visit: Payer: Self-pay | Admitting: *Deleted

## 2017-04-26 DIAGNOSIS — K219 Gastro-esophageal reflux disease without esophagitis: Secondary | ICD-10-CM

## 2017-04-26 MED ORDER — OMEPRAZOLE 40 MG PO CPDR: 40.0000 mg | DELAYED_RELEASE_CAPSULE | Freq: Every day | ORAL | 3 refills | Status: DC

## 2017-07-02 ENCOUNTER — Encounter: Payer: Self-pay | Admitting: Internal Medicine

## 2017-07-03 ENCOUNTER — Ambulatory Visit (INDEPENDENT_AMBULATORY_CARE_PROVIDER_SITE_OTHER): Payer: BLUE CROSS/BLUE SHIELD | Admitting: Internal Medicine

## 2017-07-03 ENCOUNTER — Encounter: Payer: Self-pay | Admitting: Internal Medicine

## 2017-07-03 DIAGNOSIS — J310 Chronic rhinitis: Secondary | ICD-10-CM

## 2017-07-03 DIAGNOSIS — G4733 Obstructive sleep apnea (adult) (pediatric): Secondary | ICD-10-CM

## 2017-07-03 MED ORDER — ZOLPIDEM TARTRATE 5 MG PO TABS
ORAL_TABLET | ORAL | 5 refills | Status: DC
Start: 2017-07-03 — End: 2018-05-24

## 2017-07-03 MED ORDER — AZELASTINE-FLUTICASONE 137-50 MCG/ACT NA SUSP: 1.0000 | Freq: Every day | NASAL | 0 refills | Status: DC

## 2017-07-03 NOTE — Progress Notes (Signed)
HPI- male former smoker followed for OSA, complicated by GERD NPSG 03/27/16-AHI 40.1/hour, desaturation to 85%, CPAP titration to 9, body weight 200 pounds  --------------------------------------------------------------------  03/02/2017-46 year old male former smoker followed for OSA, complicated by GERD CPAP 5-12 auto/Advanced FOLLOWS FOR:DME: [facility] Pt wears CPAP nightly. Pressure to be discussed with  patient and no new supplies needed at this time. DL attached.  Does ask about changing masks. Previously like nasal pillows. Currently having some trouble falling asleep. Download-compliance 4 hours 57%, AHI 2.0/hour. We discussed compliance goals and importance of comfort.  07/03/17- 46 year old male former smoker followed for OSA, complicated by GERD CPAP 5-12 auto/Advanced FOLLOWS FOR:DME [facility] Pt uses CPAP most of the time and trying to get about 4 hours. No new supplies needed at this time. DL attached.  Download 50% compliance, AHI 1.5/hour. If ex-girlfriend house, he uses a CPAP machine there. Still feels unrested if he uses CPAP.  Nasal mask causes nasal congestion especially when he rolls over. Denies postnasal drip. Sonata did not help maintain sleep.  ROS-see HPI   Negative unless "+" Constitutional:    weight loss, night sweats, fevers, chills,  + fatigue, lassitude. HEENT:    headaches, difficulty swallowing, tooth/dental problems, sore throat,       sneezing, itching, ear ache, + nasal congestion, post nasal drip, snoring CV:    chest pain, orthopnea, PND, swelling in lower extremities, anasarca,                                                   dizziness, palpitations Resp:   shortness of breath with exertion or at rest.                productive cough,   non-productive cough, coughing up of blood.              change in color of mucus.  wheezing.   Skin:    rash or lesions. GI:  No-   heartburn, indigestion, abdominal pain, nausea, vomiting, diarrhea,                 change in  bowel habits, loss of appetite GU: No-dysuria, change in color of urine, no urgency or frequency.   flank pain. MS:   joint pain, stiffness, decreased range of motion, back pain. Neuro-     nothing unusual Psych:  change in mood or affect.  depression or anxiety.   memory loss.  OBJ- Physical Exam   stable baseline exam-fit-looking General- Alert, Oriented, Affect-appropriate, Distress- none acute, not overweight Skin- rash-none, lesions- none, excoriation- none Lymphadenopathy- none Head- atraumatic            Eyes- Gross vision intact, PERRLA, conjunctivae and secretions clear            Ears- Hearing, canals-normal            Nose- Clear, no-Septal dev, mucus, polyps, erosion, perforation             Throat- Mallampati III-IV , mucosa clear , drainage- none, tonsils- atrophic Neck- flexible , trachea midline, no stridor , thyroid nl, carotid no bruit Chest - symmetrical excursion , unlabored           Heart/CV- RRR , no murmur , no gallop  , no rub, nl s1 s2                           -  JVD- none , edema- none, stasis changes- none, varices- none           Lung- clear to P&A, wheeze- none, cough- none , dullness-none, rub- none           Chest wall-  Abd-  Br/ Gen/ Rectal- Not done, not indicated Extrem- cyanosis- none, clubbing, none, atrophy- none, strength- nl Neuro- grossly intact to observation

## 2017-07-03 NOTE — Patient Instructions (Addendum)
Script for Remus Lofflerambien  To take for sleep if needed  Our goal is to try to wear CPAP whenever you sleep- all night, every night if possible  Sample Dymista nasal spray      1-2 puffs each night at bedtime.   Give this a few days and see if the stuffy nose gets better.  Try otc Breathe Right nasal strips under your CPAP mask  Please call as needed

## 2017-07-09 DIAGNOSIS — J310 Chronic rhinitis: Secondary | ICD-10-CM | POA: Insufficient documentation

## 2017-07-09 NOTE — Assessment & Plan Note (Signed)
He says he is using a different CPAP machine at his girlfriend's house but I don't know where that is set and I don't know if this explains his 50% compliance on current download. He says he does not feel better rested after using CPAP although control is good. Nasal congestion is an issue. There is an insomnia component  Plan-emphasized compliance goals and good sleep hygiene. Prescription to try Ambien 5 mg instead of Sonata with discussion.

## 2017-07-09 NOTE — Assessment & Plan Note (Signed)
Not clear if rhinitis with nasal congestion is allergic. He does not describe clearly Plan-try sample Dymista nasal spray, try Breathe Right strips under CPAP mask

## 2017-07-25 ENCOUNTER — Ambulatory Visit (INDEPENDENT_AMBULATORY_CARE_PROVIDER_SITE_OTHER): Payer: BLUE CROSS/BLUE SHIELD | Admitting: Family Medicine

## 2017-07-25 ENCOUNTER — Encounter: Payer: Self-pay | Admitting: Family Medicine

## 2017-07-25 ENCOUNTER — Telehealth: Payer: Self-pay | Admitting: Family Medicine

## 2017-07-25 VITALS — BP 120/64 | HR 75 | Temp 97.6°F | Ht 73.0 in | Wt 205.0 lb

## 2017-07-25 DIAGNOSIS — N529 Male erectile dysfunction, unspecified: Secondary | ICD-10-CM

## 2017-07-25 MED ORDER — TADALAFIL 5 MG PO TABS: 2.5000 mg | ORAL_TABLET | Freq: Every day | ORAL | 11 refills | Status: DC | PRN

## 2017-07-25 NOTE — Progress Notes (Signed)
   HPI  Patient presents today here to follow-up for erectile dysfunction.  Patient states that 60 mg of sildenafil had moderate improvement but caused severe side effects that he could not tolerate. He cites that he had nasal congestion and fullness, as well as feeling bad. He denies headache, shortness of breath, or chest pain. He also denies syncope.  Patient is frustrated with erections, he has difficulty maintaining. This has been a problem for about one year.  He has normal sex drive  PMH: Smoking status noted ROS: Per HPI  Objective: BP 120/64   Pulse 75   Temp 97.6 F (36.4 C) (Oral)   Ht 6\' 1"  (1.854 m)   Wt 205 lb (93 kg)   BMI 27.05 kg/m  Gen: NAD, alert, cooperative with exam HEENT: NCAT CV: RRR, good S1/S2, no murmur Resp: CTABL, no wheezes, non-labored Ext: No edema, warm Neuro: Alert and oriented, No gross deficits  Assessment and plan:  # Erectile dysfunction Discontinue sildenafil Trial of Cialis 2.5-5 mg once daily, hopefully he will tolerate low dose and get good benefits. Refer to urology to consider further treatment     Orders Placed This Encounter  Procedures  . Ambulatory referral to Urology    Referral Priority:   Routine    Referral Type:   Consultation    Referral Reason:   Specialty Services Required    Requested Specialty:   Urology    Number of Visits Requested:   1    Meds ordered this encounter  Medications  . tadalafil (CIALIS) 5 MG tablet    Sig: Take 0.5-1 tablets (2.5-5 mg total) by mouth daily as needed for erectile dysfunction.    Dispense:  30 tablet    Refill:  11    Murtis Sink, MD [facility] 07/25/2017, 10:13 AM

## 2017-07-25 NOTE — Patient Instructions (Signed)
Great to see you!  Come back as needed or once a year for erectile dysfunction.

## 2017-07-25 NOTE — Telephone Encounter (Signed)
NEEDS PRIOR AUTH FOR cIALIS PER PT

## 2017-10-11 ENCOUNTER — Ambulatory Visit: Payer: BLUE CROSS/BLUE SHIELD | Admitting: Urology

## 2017-10-11 DIAGNOSIS — R350 Frequency of micturition: Secondary | ICD-10-CM

## 2017-10-11 DIAGNOSIS — N529 Male erectile dysfunction, unspecified: Secondary | ICD-10-CM

## 2017-10-12 ENCOUNTER — Other Ambulatory Visit: Payer: Self-pay | Admitting: Family Medicine

## 2017-10-12 ENCOUNTER — Other Ambulatory Visit: Payer: BLUE CROSS/BLUE SHIELD

## 2017-10-12 DIAGNOSIS — N529 Male erectile dysfunction, unspecified: Secondary | ICD-10-CM

## 2017-10-12 NOTE — Progress Notes (Signed)
Labs placed for urology, pt here to have them drawn.   Murtis SinkSam Hadden Steig, MD [facility] 10/12/2017, 7:47 AM

## 2017-10-13 LAB — FSH/LH
FSH: 4.2 m[IU]/mL (ref 1.5–12.4)
LH: 5.6 m[IU]/mL (ref 1.7–8.6)

## 2017-10-13 LAB — TESTOSTERONE, FREE, TOTAL, SHBG
SEX HORMONE BINDING: 45 nmol/L (ref 16.5–55.9)
TESTOSTERONE FREE: 13.6 pg/mL (ref 6.8–21.5)
TESTOSTERONE: 479 ng/dL (ref 264–916)

## 2017-10-13 LAB — PROLACTIN: Prolactin: 5.8 ng/mL (ref 4.0–15.2)

## 2017-10-13 LAB — ESTRADIOL: Estradiol: 16.6 pg/mL (ref 7.6–42.6)

## 2017-11-29 ENCOUNTER — Ambulatory Visit: Payer: BLUE CROSS/BLUE SHIELD | Admitting: Urology

## 2017-11-29 DIAGNOSIS — N529 Male erectile dysfunction, unspecified: Secondary | ICD-10-CM

## 2017-11-29 DIAGNOSIS — R350 Frequency of micturition: Secondary | ICD-10-CM

## 2017-11-29 DIAGNOSIS — N401 Enlarged prostate with lower urinary tract symptoms: Secondary | ICD-10-CM | POA: Diagnosis not present

## 2018-01-01 ENCOUNTER — Encounter: Payer: Self-pay | Admitting: Internal Medicine

## 2018-01-04 ENCOUNTER — Encounter: Payer: Self-pay | Admitting: Internal Medicine

## 2018-01-04 ENCOUNTER — Ambulatory Visit: Payer: BLUE CROSS/BLUE SHIELD | Admitting: Internal Medicine

## 2018-01-04 DIAGNOSIS — F5101 Primary insomnia: Secondary | ICD-10-CM | POA: Diagnosis not present

## 2018-01-04 DIAGNOSIS — G4733 Obstructive sleep apnea (adult) (pediatric): Secondary | ICD-10-CM

## 2018-01-04 DIAGNOSIS — J310 Chronic rhinitis: Secondary | ICD-10-CM | POA: Diagnosis not present

## 2018-01-04 MED ORDER — AZELASTINE HCL 0.1 % NA SOLN: NASAL | 12 refills | Status: DC

## 2018-01-04 MED ORDER — CLONAZEPAM 0.5 MG PO TABS: ORAL_TABLET | ORAL | 5 refills | Status: DC

## 2018-01-04 NOTE — Progress Notes (Signed)
HPI- male former smoker followed for OSA, complicated by GERD NPSG 03/27/16-AHI 40.1/hour, desaturation to 85%, CPAP titration to 9, body weight 200 pounds  --------------------------------------------------------------------  07/03/17- 47 year old male former smoker followed for OSA, complicated by GERD CPAP 5-12 auto/Advanced FOLLOWS FOR:DME [facility] Pt uses CPAP most of the time and trying to get about 4 hours. No new supplies needed at this time. DL attached.  Download 50% compliance, AHI 1.5/hour. If at ex-girlfriend's house, he uses a different CPAP machine there. Still feels unrested if he uses CPAP.  Nasal mask causes nasal congestion especially when he rolls over. Denies postnasal drip. Sonata did not help maintain sleep.  01/04/18- 47 year old male former smoker followed for OSA, complicated by GERD CPAP 5-12 auto/Advanced Ambien 5 mg.  Uses different machine at his girlfriend's house. Follow up for CPAP. Wears 2 different machines-[place]  Download reflects only use of the CPAP machine connected to [facility], but he uses CPAP every night.  AHI 1.2/hour.  He sleeps better and is told snoring is controlled when he wears CPAP. Ambien caused sleepwalking but worked better for his sleep than Sonata.  We are going to try clonazepam. Dymista sample worked well for his postnasal drip and he would like prescription.  ROS-see HPI   Negative unless "+" Constitutional:    weight loss, night sweats, fevers, chills,  + fatigue, lassitude. HEENT:    headaches, difficulty swallowing, tooth/dental problems, sore throat,       sneezing, itching, ear ache, + nasal congestion, post nasal drip, snoring CV:    chest pain, orthopnea, PND, swelling in lower extremities, anasarca,                                                   dizziness, palpitations Resp:   shortness of breath with exertion or at rest.                productive cough,   non-productive cough, coughing up of blood.        change in color of mucus.  wheezing.   Skin:    rash or lesions. GI:  No-   heartburn, indigestion, abdominal pain, nausea, vomiting, diarrhea,                 change in bowel habits, loss of appetite GU: No-dysuria, change in color of urine, no urgency or frequency.   flank pain. MS:   joint pain, stiffness, decreased range of motion, back pain. Neuro-     nothing unusual Psych:  change in mood or affect.  depression or anxiety.   memory loss.  OBJ- Physical Exam   stable baseline exam-fit-looking General- Alert, Oriented, Affect-appropriate, Distress- none acute, not overweight Skin- rash-none, lesions- none, excoriation- none Lymphadenopathy- none Head- atraumatic            Eyes- Gross vision intact, PERRLA, conjunctivae and secretions clear            Ears- Hearing, canals-normal            Nose- Clear, no-Septal dev, mucus, polyps, erosion, perforation             Throat- Mallampati III-IV , mucosa clear , drainage- none, tonsils- atrophic Neck- flexible , trachea midline, no stridor , thyroid nl, carotid no bruit Chest - symmetrical excursion , unlabored  Heart/CV- RRR , no murmur , no gallop  , no rub, nl s1 s2                           - JVD- none , edema- none, stasis changes- none, varices- none           Lung- clear to P&A, wheeze- none, cough- none , dullness-none, rub- none           Chest wall-  Abd-  Br/ Gen/ Rectal- Not done, not indicated Extrem- cyanosis- none, clubbing, none, atrophy- none, strength- nl Neuro- grossly intact to observation

## 2018-01-04 NOTE — Patient Instructions (Addendum)
Script for clonazepam - try taking maybe 30 minutes before you intend to go to sleep. Try it instead of ambien  Script for Astelin nasal spray  Try 1-2 puffs each nostril, once or twice daily. This can be combined with otc Flonase/ fluticasone if you want, to get effect similar to the Dymista nasal spray you had before, but cheaper.   We can continue CPAP auto 5-20, mask of choice, humidifier, supplies, [facility]       Please call as needede

## 2018-01-07 DIAGNOSIS — G4700 Insomnia, unspecified: Secondary | ICD-10-CM | POA: Insufficient documentation

## 2018-01-07 NOTE — Assessment & Plan Note (Signed)
Perennial rhinitis probably with a seasonal component. Plan-prescription Dymista nasal spray for use when needed as discussed.

## 2018-01-07 NOTE — Assessment & Plan Note (Signed)
Download indicates compliance only 47% because only 1 of his CPAP machines is connected and he uses another machine on many nights when he is staying at his girlfriend's house.  Good control.  He reports definite benefit, sleeping better and snoring prevented.

## 2018-01-07 NOTE — Assessment & Plan Note (Signed)
Ambien worked better [employer] Sonata, but was associated with sleepwalking.  Discussion of good sleep hygiene, use of sleep medications intermittently when necessary. Plan-try clonazepam

## 2018-02-28 ENCOUNTER — Ambulatory Visit: Payer: BLUE CROSS/BLUE SHIELD | Admitting: Urology

## 2018-02-28 DIAGNOSIS — R350 Frequency of micturition: Secondary | ICD-10-CM | POA: Diagnosis not present

## 2018-02-28 DIAGNOSIS — N529 Male erectile dysfunction, unspecified: Secondary | ICD-10-CM

## 2018-02-28 DIAGNOSIS — N401 Enlarged prostate with lower urinary tract symptoms: Secondary | ICD-10-CM

## 2018-03-14 ENCOUNTER — Ambulatory Visit: Payer: BLUE CROSS/BLUE SHIELD | Admitting: Urology

## 2018-03-14 DIAGNOSIS — R350 Frequency of micturition: Secondary | ICD-10-CM

## 2018-03-14 DIAGNOSIS — N529 Male erectile dysfunction, unspecified: Secondary | ICD-10-CM

## 2018-03-14 DIAGNOSIS — N401 Enlarged prostate with lower urinary tract symptoms: Secondary | ICD-10-CM | POA: Diagnosis not present

## 2018-04-11 ENCOUNTER — Ambulatory Visit: Payer: BLUE CROSS/BLUE SHIELD | Admitting: Urology

## 2018-04-11 DIAGNOSIS — N529 Male erectile dysfunction, unspecified: Secondary | ICD-10-CM | POA: Diagnosis not present

## 2018-04-21 ENCOUNTER — Other Ambulatory Visit: Payer: Self-pay | Admitting: Family Medicine

## 2018-04-21 DIAGNOSIS — K219 Gastro-esophageal reflux disease without esophagitis: Secondary | ICD-10-CM

## 2018-05-24 ENCOUNTER — Ambulatory Visit: Payer: BLUE CROSS/BLUE SHIELD | Admitting: Family Medicine

## 2018-05-24 ENCOUNTER — Encounter: Payer: Self-pay | Admitting: Family Medicine

## 2018-05-24 VITALS — BP 119/74 | HR 71 | Temp 97.5°F | Ht 72.0 in | Wt 209.8 lb

## 2018-05-24 DIAGNOSIS — R079 Chest pain, unspecified: Secondary | ICD-10-CM

## 2018-05-24 NOTE — Patient Instructions (Signed)
Great to see you!   Nonspecific Chest Pain Chest pain can be caused by many different conditions. There is a chance that your pain could be related to something serious, such as a heart attack or a blood clot in your lungs. Chest pain can also be caused by conditions that are not life-threatening. If you have chest pain, it is very important to follow up with your doctor. Follow these instructions at home: Medicines  If you were prescribed an antibiotic medicine, take it as told by your doctor. Do not stop taking the antibiotic even if you start to feel better.  Take over-the-counter and prescription medicines only as told by your doctor. Lifestyle  Do not use any products that contain nicotine or tobacco, such as cigarettes and e-cigarettes. If you need help quitting, ask your doctor.  Do not drink alcohol.  Make lifestyle changes as told by your doctor. These may include: ? Getting regular exercise. Ask your doctor for some activities that are safe for you. ? Eating a heart-healthy diet. A diet specialist (dietitian) can help you to learn healthy eating options. ? Staying at a healthy weight. ? Managing diabetes, if needed. ? Lowering your stress, as with deep breathing or spending time in nature. General instructions  Avoid any activities that make you feel chest pain.  If your chest pain is because of heartburn: ? Raise (elevate) the head of your bed about 6 inches (15 cm). You can do this by putting blocks under the bed legs at the head of the bed. ? Do not sleep with extra pillows under your head. That does not help heartburn.  Keep all follow-up visits as told by your doctor. This is important. This includes any further testing if your chest pain does not go away. Contact a doctor if:  Your chest pain does not go away.  You have a rash with blisters on your chest.  You have a fever.  You have chills. Get help right away if:  Your chest pain is worse.  You have a  cough that gets worse, or you cough up blood.  You have very bad (severe) pain in your belly (abdomen).  You are very weak.  You pass out (faint).  You have either of these for no clear reason: ? Sudden chest discomfort. ? Sudden discomfort in your arms, back, neck, or jaw.  You have shortness of breath at any time.  You suddenly start to sweat, or your skin gets clammy.  You feel sick to your stomach (nauseous).  You throw up (vomit).  You suddenly feel light-headed or dizzy.  Your heart starts to beat fast, or it feels like it is skipping beats. These symptoms may be an emergency. Do not wait to see if the symptoms will go away. Get medical help right away. Call your local emergency services (911 in the [place] Do not drive yourself to the hospital. This information is not intended to replace advice given to you by your health care provider. Make sure you discuss any questions you have with your health care provider. Document Released: 05/02/2008 Document Revised: 08/08/2016 Document Reviewed: 08/08/2016 Elsevier Interactive Patient Education  2017 Elsevier Inc.  

## 2018-05-24 NOTE — Progress Notes (Signed)
   HPI  Patient presents today chest pain.  Patient explains he has had left-sided chest pain and shortness of breath for 2 to 3 days.  He states that it is constant in nature and does worsen with vigorous exercise. He denies any racing heart, sweating, or lightheadedness.  He is not a smoker.  His father had coronary artery disease and CABG in his 57s.  Stress test in 2014 He states that previously this is been explained as GERD so he waited 2 or 3 days, however is not going away.  He still taking PPI.  PMH: Smoking status noted ROS: Per HPI  Objective: BP 119/74   Pulse 71   Temp (!) 97.5 F (36.4 C) (Oral)   Ht 6' (1.829 m)   Wt 209 lb 12.8 oz (95.2 kg)   BMI 28.45 kg/m  Gen: NAD, alert, cooperative with exam HEENT: NCAT CV: RRR, good S1/S2, no murmur Resp: CTABL, no wheezes, non-labored Ext: No edema, warm Neuro: Alert and oriented, No gross deficits  Assessment and plan:  #Chest pain Typical and atypical features, he has risk factors of early MI in his father EKG with normal sinus rhythm, unchanged Labs today, nonfasting Refer to cardiology, appreciate the recommendations    Orders Placed This Encounter  Procedures  . CMP14+EGFR  . CBC with Differential/Platelet  . Lipid panel  . TSH  . Ambulatory referral to Cardiology    Referral Priority:   Urgent    Referral Type:   Consultation    Referral Reason:   Specialty Services Required    Requested Specialty:   Cardiology    Number of Visits Requested:   1  . EKG 12-Lead     Laroy Apple, MD [facility] 05/24/2018, 4:41 PM

## 2018-05-25 ENCOUNTER — Encounter: Payer: Self-pay | Admitting: Cardiovascular Disease

## 2018-05-25 ENCOUNTER — Ambulatory Visit: Payer: BLUE CROSS/BLUE SHIELD | Admitting: Cardiovascular Disease

## 2018-05-25 VITALS — BP 120/74 | HR 88 | Ht 72.0 in | Wt 208.0 lb

## 2018-05-25 DIAGNOSIS — F5101 Primary insomnia: Secondary | ICD-10-CM | POA: Diagnosis not present

## 2018-05-25 DIAGNOSIS — R0789 Other chest pain: Secondary | ICD-10-CM | POA: Diagnosis not present

## 2018-05-25 DIAGNOSIS — R351 Nocturia: Secondary | ICD-10-CM

## 2018-05-25 DIAGNOSIS — N529 Male erectile dysfunction, unspecified: Secondary | ICD-10-CM

## 2018-05-25 DIAGNOSIS — K219 Gastro-esophageal reflux disease without esophagitis: Secondary | ICD-10-CM

## 2018-05-25 DIAGNOSIS — G4733 Obstructive sleep apnea (adult) (pediatric): Secondary | ICD-10-CM | POA: Diagnosis not present

## 2018-05-25 DIAGNOSIS — R5383 Other fatigue: Secondary | ICD-10-CM

## 2018-05-25 DIAGNOSIS — N401 Enlarged prostate with lower urinary tract symptoms: Secondary | ICD-10-CM | POA: Diagnosis not present

## 2018-05-25 LAB — LIPID PANEL
CHOL/HDL RATIO: 4.7 ratio (ref 0.0–5.0)
Cholesterol, Total: 161 mg/dL (ref 100–199)
HDL: 34 mg/dL — ABNORMAL LOW (ref >39)
LDL CALC: 90 mg/dL (ref 0–99)
TRIGLYCERIDES: 185 mg/dL — AB (ref 0–149)
VLDL Cholesterol Cal: 37 mg/dL (ref 5–40)

## 2018-05-25 LAB — CBC WITH DIFFERENTIAL/PLATELET
Basophils Absolute: 0 10*3/uL (ref 0.0–0.2)
Basos: 0 %
EOS (ABSOLUTE): 0.3 10*3/uL (ref 0.0–0.4)
EOS: 4 %
HEMATOCRIT: 46.5 % (ref 37.5–51.0)
Hemoglobin: 16.3 g/dL (ref 13.0–17.7)
Immature Grans (Abs): 0 10*3/uL (ref 0.0–0.1)
Immature Granulocytes: 0 %
LYMPHS ABS: 2.2 10*3/uL (ref 0.7–3.1)
Lymphs: 28 %
MCH: 33.1 pg — ABNORMAL HIGH (ref 26.6–33.0)
MCHC: 35.1 g/dL (ref 31.5–35.7)
MCV: 94 fL (ref 79–97)
MONOS ABS: 0.7 10*3/uL (ref 0.1–0.9)
Monocytes: 9 %
Neutrophils Absolute: 4.6 10*3/uL (ref 1.4–7.0)
Neutrophils: 59 %
Platelets: 215 10*3/uL (ref 150–450)
RBC: 4.93 x10E6/uL (ref 4.14–5.80)
RDW: 13.2 % (ref 12.3–15.4)
WBC: 7.7 10*3/uL (ref 3.4–10.8)

## 2018-05-25 LAB — CMP14+EGFR
A/G RATIO: 1.6 (ref 1.2–2.2)
ALBUMIN: 4.4 g/dL (ref 3.5–5.5)
ALT: 19 IU/L (ref 0–44)
AST: 15 IU/L (ref 0–40)
Alkaline Phosphatase: 80 IU/L (ref 39–117)
BILIRUBIN TOTAL: 0.4 mg/dL (ref 0.0–1.2)
BUN / CREAT RATIO: 13 (ref 9–20)
BUN: 15 mg/dL (ref 6–24)
CO2: 27 mmol/L (ref 20–29)
CREATININE: 1.13 mg/dL (ref 0.76–1.27)
Calcium: 9.5 mg/dL (ref 8.7–10.2)
Chloride: 100 mmol/L (ref 96–106)
GFR calc non Af Amer: 78 mL/min/{1.73_m2} (ref >59)
GFR, EST AFRICAN AMERICAN: 90 mL/min/{1.73_m2} (ref >59)
GLOBULIN, TOTAL: 2.8 g/dL (ref 1.5–4.5)
Glucose: 103 mg/dL — ABNORMAL HIGH (ref 65–99)
Potassium: 3.9 mmol/L (ref 3.5–5.2)
SODIUM: 139 mmol/L (ref 134–144)
TOTAL PROTEIN: 7.2 g/dL (ref 6.0–8.5)

## 2018-05-25 LAB — TSH: TSH: 3.21 u[IU]/mL (ref 0.450–4.500)

## 2018-05-25 NOTE — Patient Instructions (Addendum)
Your physician recommends that you schedule a follow-up appointment ZO:XWRUin:next available with Dr.Koneswaran.     Your physician has requested that you have en exercise stress myoview. For further information please visit [email or web address]. Please follow instruction sheet, as given.     Your physician recommends that you continue on your current medications as directed. Please refer to the Current Medication list given to you today.    If you need a refill on your cardiac medications before your next appointment, please call your pharmacy.     No lab work today      Thank you for [facility] !

## 2018-05-25 NOTE — Progress Notes (Signed)
CARDIOLOGY CONSULT NOTE  Patient ID: Andrew [name] MRN: [id number] DOB/AGE: 03-24-1971 47 y.o.  Admit date: (Not on file) Primary Physician: Dettinger, Elige RadonJoshua A, MD Referring Physician:  Dettinger, Elige RadonJoshua A, MD  Reason for Consultation: Chest pain  HPI: Andrew [name] is a 47 y.o. male who is being seen today for the evaluation of chest pain at the request of Elenora GammaBradshaw, Samuel L, MD.   He has a history of chest pain and a strong family history of premature coronary artery disease.  His father had multivessel CABG at the age of 46.  He has a history of GERD and takes a proton pump inhibitor.  I reviewed notes from his PCP.  I reviewed lipids dated 05/24/2018: Total cholesterol 161, triglycerides 185, HDL 34, LDL 90.  CBC showed normal white count, hemoglobin, and platelets.  I also reviewed his TSH and comprehensive metabolic panel which were also normal.  Testosterone level was normal at 479 on 10/12/2017.  I reviewed the ECG performed yesterday which demonstrated sinus rhythm with no ischemic ST segment or T wave abnormalities, nor any arrhythmias.  He underwent a normal exercise tolerance test on 03/01/2013.  He is here with his girlfriend.  He has apparently had significant fatigue over the past 2 years.  He has also been experiencing left-sided chest tightness associated with some shortness of breath.  There is no radiation of the chest tightness to the neck, back, jaw, or arms.  Interestingly, he does not notice it with exertion and most of the time at rest.  It is not associated with diaphoresis, nausea, or vomiting.  He does not do any significant intentional exercise but does get quite a bit of activity at work.  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  He thought may be some of his symptoms were related to GERD as that is what he was told back in 2014.  He has obstructive sleep apnea and uses  CPAP.  He also has a history of multiple tick bites and recently had a tick bite about a week ago and had some swelling on the right posterior shoulder.  Social history: He is divorced and has a girlfriend.  He has 3 daughters of his own and his girlfriend has a daughter.  Family history: Father underwent four-vessel CABG at the age of 636.  He has a paternal uncle who underwent his first CABG in his 7550s and is undergoing a second CABG at age 46.   No Known Allergies  Current Outpatient Medications  Medication Sig Dispense Refill  . aspirin EC 81 MG tablet Take 81 mg by mouth daily.    Marland Kitchen. omeprazole (PRILOSEC) 40 MG capsule TAKE 1 CAPSULE BY MOUTH EVERY DAY 90 capsule 0  . tadalafil (CIALIS) 5 MG tablet Take 5 mg by mouth daily.     No current facility-administered medications for this visit.     Past Medical History:  Diagnosis Date  . Acid reflux   . High cholesterol     Past Surgical History:  Procedure Laterality Date  . NO PAST SURGERIES      Social History   Socioeconomic History  . Marital status: Divorced    Spouse name: Not on file  . Number of children: 3  . Years of education: Not on file  . Highest education level: Not on file  Occupational History  . Occupation: Location managermachine operator  Social  Needs  . Financial resource strain: Not on file  . Food insecurity:    Worry: Not on file    Inability: Not on file  . Transportation needs:    Medical: Not on file    Non-medical: Not on file  Tobacco Use  . Smoking status: Never Smoker  . Smokeless tobacco: Current User    Types: Snuff  . Tobacco comment: 1 can a day  Substance and Sexual Activity  . Alcohol use: Yes    Alcohol/week: 0.0 oz    Comment: occasionally-beer  . Drug use: No  . Sexual activity: Not on file  Lifestyle  . Physical activity:    Days per week: Not on file    Minutes per session: Not on file  . Stress: Not on file  Relationships  . Social connections:    Talks on phone: Not on file      Gets together: Not on file    Attends religious service: Not on file    Active member of club or organization: Not on file    Attends meetings of clubs or organizations: Not on file    Relationship status: Not on file  . Intimate partner violence:    Fear of current or ex partner: Not on file    Emotionally abused: Not on file    Physically abused: Not on file    Forced sexual activity: Not on file  Other Topics Concern  . Not on file  Social History Narrative  . Not on file     Current Meds  Medication Sig  . aspirin EC 81 MG tablet Take 81 mg by mouth daily.  Marland Kitchen omeprazole (PRILOSEC) 40 MG capsule TAKE 1 CAPSULE BY MOUTH EVERY DAY  . tadalafil (CIALIS) 5 MG tablet Take 5 mg by mouth daily.      Review of systems complete and found to be negative unless listed above in HPI    Physical exam Blood pressure 120/74, pulse 88, height 6' (1.829 m), weight 208 lb (94.3 kg), SpO2 97 %. General: NAD Neck: No JVD, no thyromegaly or thyroid nodule.  Lungs: Clear to auscultation bilaterally with normal respiratory effort. CV: Nondisplaced PMI. Regular rate and rhythm, normal S1/S2, no S3/S4, no murmur.  No peripheral edema.  No carotid bruit.    Abdomen: Soft, nontender, no distention.  Skin: Intact without lesions or rashes.  Neurologic: Alert and oriented x 3.  Psych: Normal affect. Extremities: No clubbing or cyanosis.  HEENT: Normal.   ECG: Most recent ECG reviewed.   Labs: Lab Results  Component Value Date/Time   K 3.9 05/24/2018 04:27 PM   BUN 15 05/24/2018 04:27 PM   CREATININE 1.13 05/24/2018 04:27 PM   ALT 19 05/24/2018 04:27 PM   TSH 3.210 05/24/2018 04:27 PM   HGB 16.3 05/24/2018 04:27 PM     Lipids: Lab Results  Component Value Date/Time   LDLCALC 90 05/24/2018 04:27 PM   CHOL 161 05/24/2018 04:27 PM   TRIG 185 (H) 05/24/2018 04:27 PM   HDL 34 (L) 05/24/2018 04:27 PM        ASSESSMENT AND PLAN:  1.  Chest tightness and fatigue: He has a strong  family history of premature coronary artery disease.  He appears to have reasonable exercise tolerance in spite of not doing any intentional exercise.  Symptoms are somewhat atypical in that he notices them primarily at rest.  His fatigue may also be attributable to significant sleep apnea. I will proceed with a  nuclear myocardial perfusion imaging study to evaluate for ischemic heart disease (exercise Myoview). I would also consider evaluating for Lyme disease.  2.  Sleep apnea: He follows with pulmonary.  He uses CPAP.  3.  GERD: Currently on omeprazole 40 mg.  4.  Benign prostatic hypertrophy with nocturia: He had been on Flomax.  He uses tadalafil for erectile dysfunction.   Disposition: Follow up within next few months   Signed: Prentice Docker, M.D., F.A.C.C.  05/25/2018, 3:53 PM

## 2018-05-30 ENCOUNTER — Encounter (HOSPITAL_COMMUNITY)
Admission: RE | Admit: 2018-05-30 | Discharge: 2018-05-30 | Disposition: A | Discharge status: Home / Self Care | Payer: BLUE CROSS/BLUE SHIELD | Source: Ambulatory Visit | Attending: Cardiovascular Disease | Admitting: Cardiovascular Disease

## 2018-05-30 ENCOUNTER — Encounter (HOSPITAL_COMMUNITY): Payer: Self-pay

## 2018-05-30 ENCOUNTER — Encounter (HOSPITAL_BASED_OUTPATIENT_CLINIC_OR_DEPARTMENT_OTHER)
Admission: RE | Admit: 2018-05-30 | Discharge: 2018-05-30 | Disposition: A | Discharge status: Home / Self Care | Payer: BLUE CROSS/BLUE SHIELD | Source: Ambulatory Visit | Attending: Cardiovascular Disease | Admitting: Cardiovascular Disease

## 2018-05-30 DIAGNOSIS — R0789 Other chest pain: Secondary | ICD-10-CM

## 2018-05-30 LAB — NM MYOCAR MULTI W/SPECT W/WALL MOTION / EF
CHL CUP MPHR: 174 {beats}/min
CHL CUP RESTING HR STRESS: 51 {beats}/min
CSEPEDS: 16 s
CSEPHR: 89 %
Estimated workload: 13.4 METS
Exercise duration (min): 11 min
LV sys vol: 59 mL
LVDIAVOL: 163 mL (ref 62–150)
Peak HR: 155 {beats}/min
RATE: 0.39
RPE: 12
SDS: 1
SRS: 1
SSS: 2
TID: 1.25

## 2018-05-30 MED ORDER — REGADENOSON 0.4 MG/5ML IV SOLN
INTRAVENOUS | Status: AC
  Filled 2018-05-30: qty 5

## 2018-05-30 MED ORDER — TECHNETIUM TC 99M TETROFOSMIN IV KIT
10.0000 | PACK | Freq: Once | INTRAVENOUS | Status: AC | PRN
Start: 2018-05-30 — End: 2018-05-30
  Administered 2018-05-30: 10 via INTRAVENOUS

## 2018-05-30 MED ORDER — TECHNETIUM TC 99M TETROFOSMIN IV KIT
30.0000 | PACK | Freq: Once | INTRAVENOUS | Status: AC | PRN
  Administered 2018-05-30: 30 via INTRAVENOUS

## 2018-05-30 MED ORDER — SODIUM CHLORIDE 0.9% FLUSH
INTRAVENOUS | Status: AC
  Administered 2018-05-30: 10 mL via INTRAVENOUS
  Filled 2018-05-30: qty 10

## 2018-07-24 ENCOUNTER — Other Ambulatory Visit: Payer: Self-pay | Admitting: Family Medicine

## 2018-07-24 DIAGNOSIS — K219 Gastro-esophageal reflux disease without esophagitis: Secondary | ICD-10-CM

## 2018-08-29 ENCOUNTER — Ambulatory Visit: Payer: BLUE CROSS/BLUE SHIELD | Admitting: Urology

## 2018-08-29 ENCOUNTER — Encounter: Payer: Self-pay | Admitting: Cardiovascular Disease

## 2018-08-29 ENCOUNTER — Ambulatory Visit: Payer: BLUE CROSS/BLUE SHIELD | Admitting: Cardiovascular Disease

## 2018-08-29 VITALS — BP 107/69 | HR 70 | Ht 72.0 in | Wt 209.0 lb

## 2018-08-29 DIAGNOSIS — G4733 Obstructive sleep apnea (adult) (pediatric): Secondary | ICD-10-CM

## 2018-08-29 DIAGNOSIS — R0789 Other chest pain: Secondary | ICD-10-CM

## 2018-08-29 DIAGNOSIS — R5383 Other fatigue: Secondary | ICD-10-CM | POA: Diagnosis not present

## 2018-08-29 DIAGNOSIS — N401 Enlarged prostate with lower urinary tract symptoms: Secondary | ICD-10-CM

## 2018-08-29 DIAGNOSIS — R351 Nocturia: Secondary | ICD-10-CM

## 2018-08-29 DIAGNOSIS — R350 Frequency of micturition: Secondary | ICD-10-CM | POA: Diagnosis not present

## 2018-08-29 DIAGNOSIS — N529 Male erectile dysfunction, unspecified: Secondary | ICD-10-CM | POA: Diagnosis not present

## 2018-08-29 DIAGNOSIS — K219 Gastro-esophageal reflux disease without esophagitis: Secondary | ICD-10-CM

## 2018-08-29 NOTE — Progress Notes (Signed)
SUBJECTIVE: The patient returns for follow-up after undergoing cardiovascular testing performed for the evaluation of chest pain.  He underwent a nuclear stress test on 05/30/2018.  There was no ST segment deviation with stress.  He had excellent exercise tolerance.  There was a defect in the mid inferoseptal location which was likely due to artifact.  Mild degree of myocardial scar could not entirely be excluded.  There were no significant ischemic territories.  The patient denies any symptoms of chest pain, palpitations, shortness of breath, lightheadedness, dizziness, leg swelling, orthopnea, PND, and syncope.  His energy levels have improved.  He is a follow-up appointment with his sleep physician/pulmonologist in February 2020.    Social history: He is divorced and has a girlfriend.  He has 3 daughters of his own and his girlfriend has a daughter.  Family history: Father underwent four-vessel CABG at the age of 59.  He has a paternal uncle who underwent his first CABG in his 39s and is undergoing a second CABG at age 69.   Review of Systems: As per "subjective", otherwise negative.  No Known Allergies  Current Outpatient Medications  Medication Sig Dispense Refill  . aspirin EC 81 MG tablet Take 81 mg by mouth daily.    Marland Kitchen omeprazole (PRILOSEC) 40 MG capsule TAKE 1 CAPSULE BY MOUTH EVERY DAY 90 capsule 0  . tadalafil (CIALIS) 5 MG tablet Take 5 mg by mouth daily.     No current facility-administered medications for this visit.     Past Medical History:  Diagnosis Date  . Acid reflux   . High cholesterol     Past Surgical History:  Procedure Laterality Date  . NO PAST SURGERIES      Social History   Socioeconomic History  . Marital status: Divorced    Spouse name: Not on file  . Number of children: 3  . Years of education: Not on file  . Highest education level: Not on file  Occupational History  . Occupation: Location manager  Social Needs  . Financial  resource strain: Not on file  . Food insecurity:    Worry: Not on file    Inability: Not on file  . Transportation needs:    Medical: Not on file    Non-medical: Not on file  Tobacco Use  . Smoking status: Never Smoker  . Smokeless tobacco: Current User    Types: Snuff  . Tobacco comment: 1 can a day  Substance and Sexual Activity  . Alcohol use: Yes    Alcohol/week: 0.0 standard drinks    Comment: occasionally-beer  . Drug use: No  . Sexual activity: Not on file  Lifestyle  . Physical activity:    Days per week: Not on file    Minutes per session: Not on file  . Stress: Not on file  Relationships  . Social connections:    Talks on phone: Not on file    Gets together: Not on file    Attends religious service: Not on file    Active member of club or organization: Not on file    Attends meetings of clubs or organizations: Not on file    Relationship status: Not on file  . Intimate partner violence:    Fear of current or ex partner: Not on file    Emotionally abused: Not on file    Physically abused: Not on file    Forced sexual activity: Not on file  Other Topics Concern  .  Not on file  Social History Narrative  . Not on file     Vitals:   08/29/18 1556  BP: 107/69  Pulse: 70  SpO2: 97%  Weight: 209 lb (94.8 kg)  Height: 6' (1.829 m)    Wt Readings from Last 3 Encounters:  08/29/18 209 lb (94.8 kg)  05/25/18 208 lb (94.3 kg)  05/24/18 209 lb 12.8 oz (95.2 kg)     PHYSICAL EXAM General: NAD HEENT: Normal. Neck: No JVD, no thyromegaly. Lungs: Clear to auscultation bilaterally with normal respiratory effort. CV: Regular rate and rhythm, normal S1/S2, no S3/S4, no murmur. No pretibial or periankle edema.  Abdomen: Soft, nontender, no distention.  Neurologic: Alert and oriented.  Psych: Normal affect. Skin: Normal. Musculoskeletal: No gross deformities.    ECG: Reviewed above under Subjective   Labs: Lab Results  Component Value Date/Time   K  3.9 05/24/2018 04:27 PM   BUN 15 05/24/2018 04:27 PM   CREATININE 1.13 05/24/2018 04:27 PM   ALT 19 05/24/2018 04:27 PM   TSH 3.210 05/24/2018 04:27 PM   HGB 16.3 05/24/2018 04:27 PM     Lipids: Lab Results  Component Value Date/Time   LDLCALC 90 05/24/2018 04:27 PM   CHOL 161 05/24/2018 04:27 PM   TRIG 185 (H) 05/24/2018 04:27 PM   HDL 34 (L) 05/24/2018 04:27 PM       ASSESSMENT AND PLAN: 1.  Chest pain: Symptoms have resolved.  Nuclear stress test results reviewed above with no significant ischemia and no ST segment deviation with stress with excellent exercise tolerance.  [facility] treadmill score portends a low risk of cardiac events.  No further cardiac testing is indicated at this time.  2.  Sleep apnea: He follows with pulmonary.  He uses CPAP.  He is scheduled for a follow-up visit with them in February 2020.  3.  GERD: Currently on omeprazole 40 mg.  4.  Benign prostatic hypertrophy with nocturia: He had been on Flomax.  He uses tadalafil for erectile dysfunction.   Disposition: Follow up with me as needed   Prentice Docker, M.D., F.A.C.C.

## 2018-08-29 NOTE — Patient Instructions (Addendum)
Your physician wants you to follow-up as needed with Dr.Koneswaran      Your physician recommends that you continue on your current medications as directed. Please refer to the Current Medication list given to you today.   If you need a refill on your cardiac medications before your next appointment, please call your pharmacy.      No lab work or tests today ordered       Thank you for choosing [facility] !

## 2018-12-28 ENCOUNTER — Telehealth: Payer: Self-pay | Admitting: Family Medicine

## 2018-12-28 MED ORDER — OSELTAMIVIR PHOSPHATE 75 MG PO CAPS: 75.0000 mg | ORAL_CAPSULE | Freq: Two times a day (BID) | ORAL | 0 refills | Status: DC

## 2018-12-28 NOTE — Telephone Encounter (Signed)
I sent the Tamiflu for him, he does not need to start unless he starts any symptoms but if he starts with any symptoms then go ahead and start it.

## 2018-12-28 NOTE — Telephone Encounter (Signed)
Please review and advise.

## 2018-12-28 NOTE — Telephone Encounter (Signed)
Patient notified

## 2019-01-07 ENCOUNTER — Encounter: Payer: Self-pay | Admitting: Internal Medicine

## 2019-01-07 ENCOUNTER — Ambulatory Visit: Payer: BLUE CROSS/BLUE SHIELD | Admitting: Internal Medicine

## 2019-01-07 VITALS — BP 128/62 | HR 76 | Ht 72.0 in | Wt 212.8 lb

## 2019-01-07 DIAGNOSIS — N529 Male erectile dysfunction, unspecified: Secondary | ICD-10-CM

## 2019-01-07 DIAGNOSIS — G4733 Obstructive sleep apnea (adult) (pediatric): Secondary | ICD-10-CM

## 2019-01-07 MED ORDER — CLONAZEPAM 0.5 MG PO TABS: 0.5000 mg | ORAL_TABLET | Freq: Two times a day (BID) | ORAL | 5 refills | Status: DC | PRN

## 2019-01-07 MED ORDER — AZELASTINE-FLUTICASONE 137-50 MCG/ACT NA SUSP: 1.0000 | Freq: Every day | NASAL | 99 refills | Status: DC

## 2019-01-07 NOTE — Patient Instructions (Signed)
Order- DME Advanced please change autopap to 4-10, continue mask of choice, humidifier, supplies, AirView  Script sent to try again with clonazepam for insomnia if needed

## 2019-01-07 NOTE — Progress Notes (Signed)
HPI- male former smoker followed for OSA, complicated by GERD NPSG 03/27/16-AHI 40.1/hour, desaturation to 85%, CPAP titration to 9, body weight 200 pounds  --------------------------------------------------------------------  01/04/18- 48 year old male former smoker followed for OSA, complicated by GERD CPAP 5-12 auto/Advanced Ambien 5 mg.  Uses different machine at his girlfriend's house. Follow up for CPAP. Wears 2 different machines-only 1 machine in [place].  Download reflects only use of the CPAP machine connected to Airview, but he uses CPAP every night.  AHI 1.2/hour.  He sleeps better and is told snoring is controlled when he wears CPAP. Ambien caused sleepwalking but worked better for his sleep than Sonata.  We are going to try clonazepam. Dymista sample worked well for his postnasal drip and he would like prescription.  01/07/2019- 48 year old male former smoker followed for OSA, complicated by GERD,  CPAP 5-12 auto/Advanced  Uses different machine at his girlfriend's house. Clonazepam,   Cardiology has evaluated him for chest pain. Download compliance 50% (reflecting use of 2 machines as noted), AHI 1.8/hour Occasionally CPAP wakes him that he takes it off in his sleep-still finds it bothersome but recognizes he sleeps better using it.  We discussed mask fit and comfort issues. Likes Dymista for management of postnasal drainage. Has urology problems-nocturia, ED.  I suggested he go back to the urologist he had seen before.  ROS-see HPI   + = positive Constitutional:    weight loss, night sweats, fevers, chills,  + fatigue, lassitude. HEENT:    headaches, difficulty swallowing, tooth/dental problems, sore throat,       sneezing, itching, ear ache, + nasal congestion, post nasal drip, snoring CV:    chest pain, orthopnea, PND, swelling in lower extremities, anasarca,                                                   dizziness, palpitations Resp:   shortness of breath with  exertion or at rest.                productive cough,   non-productive cough, coughing up of blood.              change in color of mucus.  wheezing.   Skin:    rash or lesions. GI:  No-   heartburn, indigestion, abdominal pain, nausea, vomiting, diarrhea,                 change in bowel habits, loss of appetite GU: No-dysuria, change in color of urine, no urgency or frequency.   flank pain. MS:   joint pain, stiffness, decreased range of motion, back pain. Neuro-     nothing unusual Psych:  change in mood or affect.  depression or anxiety.   memory loss.  OBJ- Physical Exam   stable baseline exam-fit-looking General- Alert, Oriented, Affect-appropriate, Distress- none acute, not overweight Skin- rash-none, lesions- none, excoriation- none Lymphadenopathy- none Head- atraumatic            Eyes- Gross vision intact, PERRLA, conjunctivae and secretions clear            Ears- Hearing, canals-normal            Nose- Clear, no-Septal dev, mucus, polyps, erosion, perforation             Throat- Mallampati III-IV , mucosa clear , drainage- none, tonsils- atrophic Neck-  flexible , trachea midline, no stridor , thyroid nl, carotid no bruit Chest - symmetrical excursion , unlabored           Heart/CV- RRR , no murmur , no gallop  , no rub, nl s1 s2                           - JVD- none , edema- none, stasis changes- none, varices- none           Lung- clear to P&A, wheeze- none, cough- none , dullness-none, rub- none           Chest wall-  Abd-  Br/ Gen/ Rectal- Not done, not indicated Extrem- cyanosis- none, clubbing, none, atrophy- none, strength- nl Neuro- grossly intact to observation

## 2019-01-08 NOTE — Assessment & Plan Note (Signed)
He continues to benefit from CPAP with improved sleep quality.  He feels pressure gets too high at times.  We reviewed his download.  We are going to accept a little loss of control while we try lower pressure. Plan-change CPAP to auto 4-10

## 2019-01-08 NOTE — Assessment & Plan Note (Signed)
He had discussed BPH and ED with a urologist previously, but expresses concern that he was not given a follow-up appointment.  Since symptoms still bother him, I recommended he call for follow-up appointment himself.

## 2019-03-12 DIAGNOSIS — R350 Frequency of micturition: Secondary | ICD-10-CM | POA: Diagnosis not present

## 2019-03-12 DIAGNOSIS — N401 Enlarged prostate with lower urinary tract symptoms: Secondary | ICD-10-CM | POA: Diagnosis not present

## 2019-03-12 DIAGNOSIS — N529 Male erectile dysfunction, unspecified: Secondary | ICD-10-CM | POA: Diagnosis not present

## 2019-04-01 IMAGING — NM NM MYOCAR MULTI W/SPECT W/WALL MOTION & EF
2 series · 12 of 12 positions shown · non-contrast
Comparison: none

[Series 1: rest · 6.51mm/px · 6 of 64 frames shown]
[frame 6/64]
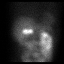
[frame 16/64]
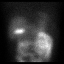
[frame 27/64]
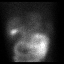
[frame 38/64]
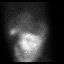
[frame 48/64]
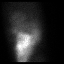
[frame 59/64]
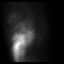

[Series 3: stress gated - perfusion · 6.51mm/px · 6 of 64 frames shown]
[frame 6/64]
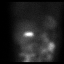
[frame 16/64]
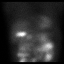
[frame 27/64]
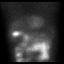
[frame 38/64]
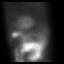
[frame 48/64]
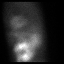
[frame 59/64]
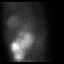

[12 of 12 positions shown; findings below may reference images not displayed]

Canned report from images found in remote index.

Refer to host system for actual result text.

## 2019-04-08 ENCOUNTER — Telehealth: Payer: Self-pay | Admitting: Family Medicine

## 2019-12-04 ENCOUNTER — Ambulatory Visit (INDEPENDENT_AMBULATORY_CARE_PROVIDER_SITE_OTHER): Payer: BC Managed Care – PPO | Admitting: Urology

## 2019-12-04 ENCOUNTER — Encounter: Payer: Self-pay | Admitting: Urology

## 2019-12-04 ENCOUNTER — Other Ambulatory Visit: Payer: Self-pay

## 2019-12-04 VITALS — BP 123/77 | HR 71 | Temp 97.4°F | Ht 72.0 in | Wt 206.0 lb

## 2019-12-04 DIAGNOSIS — N138 Other obstructive and reflux uropathy: Secondary | ICD-10-CM | POA: Diagnosis not present

## 2019-12-04 DIAGNOSIS — R339 Retention of urine, unspecified: Secondary | ICD-10-CM | POA: Diagnosis not present

## 2019-12-04 DIAGNOSIS — N401 Enlarged prostate with lower urinary tract symptoms: Secondary | ICD-10-CM

## 2019-12-04 DIAGNOSIS — N5201 Erectile dysfunction due to arterial insufficiency: Secondary | ICD-10-CM | POA: Diagnosis not present

## 2019-12-04 MED ORDER — TADALAFIL 20 MG PO TABS: 20.0000 mg | ORAL_TABLET | Freq: Every day | ORAL | 3 refills | Status: DC | PRN

## 2019-12-04 NOTE — Patient Instructions (Signed)
Erectile Dysfunction Erectile dysfunction (ED) is the inability to get or keep an erection in order to have sexual intercourse. Erectile dysfunction may include:  Inability to get an erection.  Lack of enough hardness of the erection to allow penetration.  Loss of the erection before sex is finished. What are the causes? This condition may be caused by:  Certain medicines, such as: ? Pain relievers. ? Antihistamines. ? Antidepressants. ? Blood pressure medicines. ? Water pills (diuretics). ? Ulcer medicines. ? Muscle relaxants. ? Drugs.  Excessive drinking.  Psychological causes, such as: ? Anxiety. ? Depression. ? Sadness. ? Exhaustion. ? Performance fear. ? Stress.  Physical causes, such as: ? Artery problems. This may include diabetes, smoking, liver disease, or atherosclerosis. ? High blood pressure. ? Hormonal problems, such as low testosterone. ? Obesity. ? Nerve problems. This may include back or pelvic injuries, diabetes mellitus, multiple sclerosis, or Parkinson disease. What are the signs or symptoms? Symptoms of this condition include:  Inability to get an erection.  Lack of enough hardness of the erection to allow penetration.  Loss of the erection before sex is finished.  Normal erections at some times, but with frequent unsatisfactory episodes.  Low sexual satisfaction in either partner due to erection problems.  A curved penis occurring with erection. The curve may cause pain or the penis may be too curved to allow for intercourse.  Never having nighttime erections. How is this diagnosed? This condition is often diagnosed by:  Performing a physical exam to find other diseases or specific problems with the penis.  Asking you detailed questions about the problem.  Performing blood tests to check for diabetes mellitus or to measure hormone levels.  Performing other tests to check for underlying health conditions.  Performing an ultrasound  exam to check for scarring.  Performing a test to check blood flow to the penis.  Doing a sleep study at home to measure nighttime erections. How is this treated? This condition may be treated by:  Medicine taken by mouth to help you achieve an erection (oral medicine).  Hormone replacement therapy to replace low testosterone levels.  Medicine that is injected into the penis. Your health care provider may instruct you how to give yourself these injections at home.  Vacuum pump. This is a pump with a ring on it. The pump and ring are placed on the penis and used to create pressure that helps the penis become erect.  Penile implant surgery. In this procedure, you may receive: ? An inflatable implant. This consists of cylinders, a pump, and a reservoir. The cylinders can be inflated with a fluid that helps to create an erection, and they can be deflated after intercourse. ? A semi-rigid implant. This consists of two silicone rubber rods. The rods provide some rigidity. They are also flexible, so the penis can both curve downward in its normal position and become straight for sexual intercourse.  Blood vessel surgery, to improve blood flow to the penis. During this procedure, a blood vessel from a different part of the body is placed into the penis to allow blood to flow around (bypass) damaged or blocked blood vessels.  Lifestyle changes, such as exercising more, losing weight, and quitting smoking. Follow these instructions at home: Medicines   Take over-the-counter and prescription medicines only as told by your health care provider. Do not increase the dosage without first discussing it with your health care provider.  If you are using self-injections, perform injections as directed by your   health care provider. Make sure to avoid any veins that are on the surface of the penis. After giving an injection, apply pressure to the injection site for 5 minutes. General  instructions  Exercise regularly, as directed by your health care provider. Work with your health care provider to lose weight, if needed.  Do not use any products that contain nicotine or tobacco, such as cigarettes and e-cigarettes. If you need help quitting, ask your health care provider.  Before using a vacuum pump, read the instructions that come with the pump and discuss any questions with your health care provider.  Keep all follow-up visits as told by your health care provider. This is important. Contact a health care provider if:  You feel nauseous.  You vomit. Get help right away if:  You are taking oral or injectable medicines and you have an erection that lasts longer than 4 hours. If your health care provider is unavailable, go to the nearest emergency room for evaluation. An erection that lasts much longer than 4 hours can result in permanent damage to your penis.  You have severe pain in your groin or abdomen.  You develop redness or severe swelling of your penis.  You have redness spreading up into your groin or lower abdomen.  You are unable to urinate.  You experience chest pain or a rapid heart beat (palpitations) after taking oral medicines. Summary  Erectile dysfunction (ED) is the inability to get or keep an erection during sexual intercourse. This problem can usually be treated successfully.  This condition is diagnosed based on a physical exam, your symptoms, and tests to determine the cause. Treatment varies depending on the cause, and may include medicines, hormone therapy, surgery, or vacuum pump.  You may need follow-up visits to make sure that you are using your medicines or devices correctly.  Get help right away if you are taking or injecting medicines and you have an erection that lasts longer than 4 hours. This information is not intended to replace advice given to you by your health care provider. Make sure you discuss any questions you have with  your health care provider. Document Revised: 10/27/2017 Document Reviewed: 11/30/2016 Elsevier Patient Education  2020 Elsevier Inc.  

## 2019-12-04 NOTE — Progress Notes (Signed)
12/04/2019 4:07 PM   Andrew [name] Oct 12, 1971 [id number]  Referring provider: Dettinger, Elige Radon, MD [address] [place],  [place] [address]  Erectile dysfunction  HPI: Andrew [name] is a 49yo with a hx of ED here for followup. He has tried daily cialis for both BPH and ED which originally worked but now the patient has worsening urgency. He has a feeling of incomplete emptying. No nocturia. Stream fair. His ED is improved with cialis 5mg  daily but he has issues maintaining the erection. He has not tried the as needed dose.    PMH: Past Medical History:  Diagnosis Date  . Acid reflux   . High cholesterol     Surgical History: Past Surgical History:  Procedure Laterality Date  . NO PAST SURGERIES      Home Medications:  Allergies as of 12/04/2019   No Known Allergies     Medication List       Accurate as of December 04, 2019  4:07 PM. If you have any questions, ask your nurse or doctor.        aspirin EC 81 MG tablet Take 81 mg by mouth daily.   Azelastine-Fluticasone 137-50 MCG/ACT Susp Commonly known as: Dymista Place 1-2 puffs into the nose at bedtime.   clonazePAM 0.5 MG tablet Commonly known as: KLONOPIN Take 1 tablet (0.5 mg total) by mouth 2 (two) times daily as needed for anxiety.   cyanocobalamin 2000 MCG tablet Take 2,000 mcg by mouth daily.   omeprazole 40 MG capsule Commonly known as: PRILOSEC TAKE 1 CAPSULE BY MOUTH EVERY DAY   tadalafil 5 MG tablet Commonly known as: CIALIS Take 5 mg by mouth daily.       Allergies: No Known Allergies  Family History: Family History  Problem Relation Age of Onset  . Heart disease Father     Social History:  reports that he has never smoked. His smokeless tobacco use includes snuff. He reports current alcohol use. He reports that he does not use drugs.  ROS:                                        Physical Exam: BP 123/77   Pulse 71   Temp (!) 97.4 F (36.3 C)   Ht  6' (1.829 m)   Wt 206 lb (93.4 kg)   BMI 27.94 kg/m   Constitutional:  Alert and oriented, No acute distress. HEENT: [place] AT, moist mucus membranes.  Trachea midline, no masses. Cardiovascular: No clubbing, cyanosis, or edema. Respiratory: Normal respiratory effort, no increased work of breathing. GI: Abdomen is soft, nontender, nondistended, no abdominal masses GU: No CVA tenderness Lymph: No cervical or inguinal lymphadenopathy. Skin: No rashes, bruises or suspicious lesions. Neurologic: Grossly intact, no focal deficits, moving all 4 extremities. Psychiatric: Normal mood and affect.  Laboratory Data: Lab Results  Component Value Date   WBC 7.7 05/24/2018   HGB 16.3 05/24/2018   HCT 46.5 05/24/2018   MCV 94 05/24/2018   PLT 215 05/24/2018    Lab Results  Component Value Date   CREATININE 1.13 05/24/2018    No results found for: PSA  Lab Results  Component Value Date   TESTOSTERONE 479 10/12/2017    No results found for: HGBA1C  Urinalysis No results found for: COLORURINE, APPEARANCEUR, LABSPEC, PHURINE, GLUCOSEU, HGBUR, BILIRUBINUR, KETONESUR, PROTEINUR, UROBILINOGEN, NITRITE, LEUKOCYTESUR  No results found for:  LABMICR, WBCUA, RBCUA, LABEPIT, MUCUS, BACTERIA  Pertinent Imaging:  No results found for this or any previous visit. No results found for this or any previous visit. No results found for this or any previous visit. No results found for this or any previous visit. No results found for this or any previous visit. No results found for this or any previous visit. No results found for this or any previous visit. No results found for this or any previous visit.  Assessment & Plan:    1. ED: -tadalafil 20mg  prn  2. BPH with LUTS, incomplete emptying -tadalafil 5mg      No follow-ups on file.  Nicolette Bang, MD  [facility] [place]

## 2019-12-04 NOTE — Progress Notes (Signed)

## 2019-12-18 ENCOUNTER — Ambulatory Visit: Payer: BC Managed Care – PPO | Admitting: Urology

## 2020-01-22 ENCOUNTER — Other Ambulatory Visit: Payer: Self-pay | Admitting: Urology

## 2020-01-22 ENCOUNTER — Ambulatory Visit (INDEPENDENT_AMBULATORY_CARE_PROVIDER_SITE_OTHER): Payer: BC Managed Care – PPO | Admitting: Urology

## 2020-01-22 ENCOUNTER — Other Ambulatory Visit: Payer: Self-pay

## 2020-01-22 ENCOUNTER — Encounter: Payer: Self-pay | Admitting: Urology

## 2020-01-22 VITALS — BP 116/75 | HR 61 | Temp 97.7°F | Ht 72.0 in | Wt 208.0 lb

## 2020-01-22 DIAGNOSIS — N5201 Erectile dysfunction due to arterial insufficiency: Secondary | ICD-10-CM | POA: Diagnosis not present

## 2020-01-22 DIAGNOSIS — N138 Other obstructive and reflux uropathy: Secondary | ICD-10-CM | POA: Diagnosis not present

## 2020-01-22 DIAGNOSIS — E291 Testicular hypofunction: Secondary | ICD-10-CM | POA: Diagnosis not present

## 2020-01-22 DIAGNOSIS — R339 Retention of urine, unspecified: Secondary | ICD-10-CM

## 2020-01-22 DIAGNOSIS — N401 Enlarged prostate with lower urinary tract symptoms: Secondary | ICD-10-CM | POA: Diagnosis not present

## 2020-01-22 NOTE — Progress Notes (Signed)
Urological Symptom Review  Patient is experiencing the following symptoms: No.   Review of Systems  Gastrointestinal (upper)  : Negative for upper GI symptoms  Gastrointestinal (lower) : Negative for lower GI symptoms  Constitutional : Negative for symptoms  Skin: Negative for skin symptoms  Eyes: Negative for eye symptoms  Ear/Nose/Throat : Negative for Ear/Nose/Throat symptoms  Hematologic/Lymphatic: Negative for Hematologic/Lymphatic symptoms  Cardiovascular : Negative for cardiovascular symptoms  Respiratory : Negative for respiratory symptoms  Endocrine: Negative for endocrine symptoms  Musculoskeletal: Negative for musculoskeletal symptoms  Neurological: Negative for neurological symptoms  Psychologic: Negative for psychiatric symptoms

## 2020-01-22 NOTE — Patient Instructions (Signed)
Erectile Dysfunction Erectile dysfunction (ED) is the inability to get or keep an erection in order to have sexual intercourse. Erectile dysfunction may include:  Inability to get an erection.  Lack of enough hardness of the erection to allow penetration.  Loss of the erection before sex is finished. What are the causes? This condition may be caused by:  Certain medicines, such as: ? Pain relievers. ? Antihistamines. ? Antidepressants. ? Blood pressure medicines. ? Water pills (diuretics). ? Ulcer medicines. ? Muscle relaxants. ? Drugs.  Excessive drinking.  Psychological causes, such as: ? Anxiety. ? Depression. ? Sadness. ? Exhaustion. ? Performance fear. ? Stress.  Physical causes, such as: ? Artery problems. This may include diabetes, smoking, liver disease, or atherosclerosis. ? High blood pressure. ? Hormonal problems, such as low testosterone. ? Obesity. ? Nerve problems. This may include back or pelvic injuries, diabetes mellitus, multiple sclerosis, or Parkinson disease. What are the signs or symptoms? Symptoms of this condition include:  Inability to get an erection.  Lack of enough hardness of the erection to allow penetration.  Loss of the erection before sex is finished.  Normal erections at some times, but with frequent unsatisfactory episodes.  Low sexual satisfaction in either partner due to erection problems.  A curved penis occurring with erection. The curve may cause pain or the penis may be too curved to allow for intercourse.  Never having nighttime erections. How is this diagnosed? This condition is often diagnosed by:  Performing a physical exam to find other diseases or specific problems with the penis.  Asking you detailed questions about the problem.  Performing blood tests to check for diabetes mellitus or to measure hormone levels.  Performing other tests to check for underlying health conditions.  Performing an ultrasound  exam to check for scarring.  Performing a test to check blood flow to the penis.  Doing a sleep study at home to measure nighttime erections. How is this treated? This condition may be treated by:  Medicine taken by mouth to help you achieve an erection (oral medicine).  Hormone replacement therapy to replace low testosterone levels.  Medicine that is injected into the penis. Your health care provider may instruct you how to give yourself these injections at home.  Vacuum pump. This is a pump with a ring on it. The pump and ring are placed on the penis and used to create pressure that helps the penis become erect.  Penile implant surgery. In this procedure, you may receive: ? An inflatable implant. This consists of cylinders, a pump, and a reservoir. The cylinders can be inflated with a fluid that helps to create an erection, and they can be deflated after intercourse. ? A semi-rigid implant. This consists of two silicone rubber rods. The rods provide some rigidity. They are also flexible, so the penis can both curve downward in its normal position and become straight for sexual intercourse.  Blood vessel surgery, to improve blood flow to the penis. During this procedure, a blood vessel from a different part of the body is placed into the penis to allow blood to flow around (bypass) damaged or blocked blood vessels.  Lifestyle changes, such as exercising more, losing weight, and quitting smoking. Follow these instructions at home: Medicines   Take over-the-counter and prescription medicines only as told by your health care provider. Do not increase the dosage without first discussing it with your health care provider.  If you are using self-injections, perform injections as directed by your   health care provider. Make sure to avoid any veins that are on the surface of the penis. After giving an injection, apply pressure to the injection site for 5 minutes. General  instructions  Exercise regularly, as directed by your health care provider. Work with your health care provider to lose weight, if needed.  Do not use any products that contain nicotine or tobacco, such as cigarettes and e-cigarettes. If you need help quitting, ask your health care provider.  Before using a vacuum pump, read the instructions that come with the pump and discuss any questions with your health care provider.  Keep all follow-up visits as told by your health care provider. This is important. Contact a health care provider if:  You feel nauseous.  You vomit. Get help right away if:  You are taking oral or injectable medicines and you have an erection that lasts longer than 4 hours. If your health care provider is unavailable, go to the nearest emergency room for evaluation. An erection that lasts much longer than 4 hours can result in permanent damage to your penis.  You have severe pain in your groin or abdomen.  You develop redness or severe swelling of your penis.  You have redness spreading up into your groin or lower abdomen.  You are unable to urinate.  You experience chest pain or a rapid heart beat (palpitations) after taking oral medicines. Summary  Erectile dysfunction (ED) is the inability to get or keep an erection during sexual intercourse. This problem can usually be treated successfully.  This condition is diagnosed based on a physical exam, your symptoms, and tests to determine the cause. Treatment varies depending on the cause, and may include medicines, hormone therapy, surgery, or vacuum pump.  You may need follow-up visits to make sure that you are using your medicines or devices correctly.  Get help right away if you are taking or injecting medicines and you have an erection that lasts longer than 4 hours. This information is not intended to replace advice given to you by your health care provider. Make sure you discuss any questions you have with  your health care provider. Document Revised: 10/27/2017 Document Reviewed: 11/30/2016 Elsevier Patient Education  2020 Elsevier Inc.  

## 2020-01-22 NOTE — Progress Notes (Signed)
01/22/2020 1:44 PM   Andrew [name] 1971/10/27 [id number]  Referring provider: Dettinger, Fransisca Kaufmann, MD [place],  [place] [address]  ED and incomplete emptying  HPI: Andrew [name] is a 49yo her for followup for BPH with incomplete emptying and ED. He takes tadalafil 5mg  daily and 20mg  prn for his ED which has failed to improve his ability to maintain and erection. Good libido.  His urination is stable and mild on tadalafil 5mg  daily    PMH: Past Medical History:  Diagnosis Date  . Acid reflux   . High cholesterol     Surgical History: Past Surgical History:  Procedure Laterality Date  . NO PAST SURGERIES      Home Medications:  Allergies as of 01/22/2020   No Known Allergies     Medication List       Accurate as of January 22, 2020  1:44 PM. If you have any questions, ask your nurse or doctor.        aspirin EC 81 MG tablet Take 81 mg by mouth daily.   Azelastine-Fluticasone 137-50 MCG/ACT Susp Commonly known as: Dymista Place 1-2 puffs into the nose at bedtime.   clonazePAM 0.5 MG tablet Commonly known as: KLONOPIN Take 1 tablet (0.5 mg total) by mouth 2 (two) times daily as needed for anxiety.   cyanocobalamin 2000 MCG tablet Take 2,000 mcg by mouth daily.   omeprazole 40 MG capsule Commonly known as: PRILOSEC TAKE 1 CAPSULE BY MOUTH EVERY DAY   tadalafil 5 MG tablet Commonly known as: CIALIS Take 5 mg by mouth daily.   tadalafil 20 MG tablet Commonly known as: CIALIS Take 1 tablet (20 mg total) by mouth daily as needed for erectile dysfunction.       Allergies: No Known Allergies  Family History: Family History  Problem Relation Age of Onset  . Heart disease Father     Social History:  reports that he has never smoked. His smokeless tobacco use includes snuff. He reports current alcohol use. He reports that he does not use drugs.  ROS: All other review of systems were reviewed and are negative except what is noted above  in HPI  Physical Exam: BP 116/75   Pulse 61   Temp 97.7 F (36.5 C)   Ht 6' (1.829 m)   Wt 208 lb (94.3 kg)   BMI 28.21 kg/m   Constitutional:  Alert and oriented, No acute distress. HEENT: [place] AT, moist mucus membranes.  Trachea midline, no masses. Cardiovascular: No clubbing, cyanosis, or edema. Respiratory: Normal respiratory effort, no increased work of breathing. GI: Abdomen is soft, nontender, nondistended, no abdominal masses GU: No CVA tenderness Lymph: No cervical or inguinal lymphadenopathy. Skin: No rashes, bruises or suspicious lesions. Neurologic: Grossly intact, no focal deficits, moving all 4 extremities. Psychiatric: Normal mood and affect.  Laboratory Data: Lab Results  Component Value Date   WBC 7.7 05/24/2018   HGB 16.3 05/24/2018   HCT 46.5 05/24/2018   MCV 94 05/24/2018   PLT 215 05/24/2018    Lab Results  Component Value Date   CREATININE 1.13 05/24/2018    No results found for: PSA  Lab Results  Component Value Date   TESTOSTERONE 479 10/12/2017    No results found for: HGBA1C  Urinalysis No results found for: COLORURINE, APPEARANCEUR, LABSPEC, PHURINE, GLUCOSEU, HGBUR, BILIRUBINUR, KETONESUR, PROTEINUR, UROBILINOGEN, NITRITE, LEUKOCYTESUR  No results found for: LABMICR, WBCUA, RBCUA, LABEPIT, MUCUS, BACTERIA  Pertinent Imaging:  No results found  for this or any previous visit. No results found for this or any previous visit. No results found for this or any previous visit. No results found for this or any previous visit. No results found for this or any previous visit. No results found for this or any previous visit. No results found for this or any previous visit. No results found for this or any previous visit.  Assessment & Plan:    1. Erectile dysfunction due to arterial insufficiency -TSH, testosterone labs, will call with results   2. Benign prostatic hyperplasia with urinary obstruction -continue tadalafil 5mg   daily  3. Incomplete emptying of bladder -continue tadalafil 5mg  daily   No follow-ups on file.  , MD  [facility] [place]

## 2020-01-24 LAB — TESTOSTERONE: Testosterone: 351 ng/dL (ref 250–827)

## 2020-01-24 LAB — TESTOSTERONE, FREE: TESTOSTERONE FREE: 52.5 pg/mL (ref 46.0–224.0)

## 2020-01-24 LAB — TSH: TSH: 2.92 mIU/L (ref 0.40–4.50)

## 2020-02-04 NOTE — Progress Notes (Signed)
Labs sent via MyChart

## 2020-02-05 ENCOUNTER — Ambulatory Visit (INDEPENDENT_AMBULATORY_CARE_PROVIDER_SITE_OTHER): Payer: BC Managed Care – PPO | Admitting: Urology

## 2020-02-05 ENCOUNTER — Encounter: Payer: Self-pay | Admitting: Urology

## 2020-02-05 ENCOUNTER — Other Ambulatory Visit: Payer: Self-pay

## 2020-02-05 VITALS — BP 118/68 | HR 74 | Temp 98.1°F | Ht 72.0 in | Wt 205.0 lb

## 2020-02-05 DIAGNOSIS — N5201 Erectile dysfunction due to arterial insufficiency: Secondary | ICD-10-CM | POA: Diagnosis not present

## 2020-02-05 DIAGNOSIS — R200 Anesthesia of skin: Secondary | ICD-10-CM

## 2020-02-05 NOTE — Patient Instructions (Signed)
Erectile Dysfunction Erectile dysfunction (ED) is the inability to get or keep an erection in order to have sexual intercourse. Erectile dysfunction may include:  Inability to get an erection.  Lack of enough hardness of the erection to allow penetration.  Loss of the erection before sex is finished. What are the causes? This condition may be caused by:  Certain medicines, such as: ? Pain relievers. ? Antihistamines. ? Antidepressants. ? Blood pressure medicines. ? Water pills (diuretics). ? Ulcer medicines. ? Muscle relaxants. ? Drugs.  Excessive drinking.  Psychological causes, such as: ? Anxiety. ? Depression. ? Sadness. ? Exhaustion. ? Performance fear. ? Stress.  Physical causes, such as: ? Artery problems. This may include diabetes, smoking, liver disease, or atherosclerosis. ? High blood pressure. ? Hormonal problems, such as low testosterone. ? Obesity. ? Nerve problems. This may include back or pelvic injuries, diabetes mellitus, multiple sclerosis, or Parkinson disease. What are the signs or symptoms? Symptoms of this condition include:  Inability to get an erection.  Lack of enough hardness of the erection to allow penetration.  Loss of the erection before sex is finished.  Normal erections at some times, but with frequent unsatisfactory episodes.  Low sexual satisfaction in either partner due to erection problems.  A curved penis occurring with erection. The curve may cause pain or the penis may be too curved to allow for intercourse.  Never having nighttime erections. How is this diagnosed? This condition is often diagnosed by:  Performing a physical exam to find other diseases or specific problems with the penis.  Asking you detailed questions about the problem.  Performing blood tests to check for diabetes mellitus or to measure hormone levels.  Performing other tests to check for underlying health conditions.  Performing an ultrasound  exam to check for scarring.  Performing a test to check blood flow to the penis.  Doing a sleep study at home to measure nighttime erections. How is this treated? This condition may be treated by:  Medicine taken by mouth to help you achieve an erection (oral medicine).  Hormone replacement therapy to replace low testosterone levels.  Medicine that is injected into the penis. Your health care provider may instruct you how to give yourself these injections at home.  Vacuum pump. This is a pump with a ring on it. The pump and ring are placed on the penis and used to create pressure that helps the penis become erect.  Penile implant surgery. In this procedure, you may receive: ? An inflatable implant. This consists of cylinders, a pump, and a reservoir. The cylinders can be inflated with a fluid that helps to create an erection, and they can be deflated after intercourse. ? A semi-rigid implant. This consists of two silicone rubber rods. The rods provide some rigidity. They are also flexible, so the penis can both curve downward in its normal position and become straight for sexual intercourse.  Blood vessel surgery, to improve blood flow to the penis. During this procedure, a blood vessel from a different part of the body is placed into the penis to allow blood to flow around (bypass) damaged or blocked blood vessels.  Lifestyle changes, such as exercising more, losing weight, and quitting smoking. Follow these instructions at home: Medicines   Take over-the-counter and prescription medicines only as told by your health care provider. Do not increase the dosage without first discussing it with your health care provider.  If you are using self-injections, perform injections as directed by your   health care provider. Make sure to avoid any veins that are on the surface of the penis. After giving an injection, apply pressure to the injection site for 5 minutes. General  instructions  Exercise regularly, as directed by your health care provider. Work with your health care provider to lose weight, if needed.  Do not use any products that contain nicotine or tobacco, such as cigarettes and e-cigarettes. If you need help quitting, ask your health care provider.  Before using a vacuum pump, read the instructions that come with the pump and discuss any questions with your health care provider.  Keep all follow-up visits as told by your health care provider. This is important. Contact a health care provider if:  You feel nauseous.  You vomit. Get help right away if:  You are taking oral or injectable medicines and you have an erection that lasts longer than 4 hours. If your health care provider is unavailable, go to the nearest emergency room for evaluation. An erection that lasts much longer than 4 hours can result in permanent damage to your penis.  You have severe pain in your groin or abdomen.  You develop redness or severe swelling of your penis.  You have redness spreading up into your groin or lower abdomen.  You are unable to urinate.  You experience chest pain or a rapid heart beat (palpitations) after taking oral medicines. Summary  Erectile dysfunction (ED) is the inability to get or keep an erection during sexual intercourse. This problem can usually be treated successfully.  This condition is diagnosed based on a physical exam, your symptoms, and tests to determine the cause. Treatment varies depending on the cause, and may include medicines, hormone therapy, surgery, or vacuum pump.  You may need follow-up visits to make sure that you are using your medicines or devices correctly.  Get help right away if you are taking or injecting medicines and you have an erection that lasts longer than 4 hours. This information is not intended to replace advice given to you by your health care provider. Make sure you discuss any questions you have with  your health care provider. Document Revised: 10/27/2017 Document Reviewed: 11/30/2016 Elsevier Patient Education  2020 Elsevier Inc.  

## 2020-02-05 NOTE — Progress Notes (Signed)
02/05/2020 1:38 PM   Gavin Pound Rawlinson [birth year] [id number]  Referring provider: Dettinger, Fransisca Kaufmann, MD [place],  [place] [address]  Erectile dysfunction  HPI: Mr Andrew [name] is a 49yo here for followup for erectile dysfunction. He gets a strong erection which is positional. When he loses his erection he also gets numbness in his right leg. Testosterone and TSH labs reviewed and were normal.    PMH: Past Medical History:  Diagnosis Date  . Acid reflux   . High cholesterol     Surgical History: Past Surgical History:  Procedure Laterality Date  . NO PAST SURGERIES      Home Medications:  Allergies as of 02/05/2020   No Known Allergies     Medication List       Accurate as of February 05, 2020  1:38 PM. If you have any questions, ask your nurse or doctor.        aspirin EC 81 MG tablet Take 81 mg by mouth daily.   Azelastine-Fluticasone 137-50 MCG/ACT Susp Commonly known as: Dymista Place 1-2 puffs into the nose at bedtime.   clonazePAM 0.5 MG tablet Commonly known as: KLONOPIN Take 1 tablet (0.5 mg total) by mouth 2 (two) times daily as needed for anxiety.   cyanocobalamin 2000 MCG tablet Take 2,000 mcg by mouth daily.   omeprazole 40 MG capsule Commonly known as: PRILOSEC TAKE 1 CAPSULE BY MOUTH EVERY DAY   tadalafil 5 MG tablet Commonly known as: CIALIS Take 5 mg by mouth daily.   tadalafil 20 MG tablet Commonly known as: CIALIS Take 1 tablet (20 mg total) by mouth daily as needed for erectile dysfunction.       Allergies: No Known Allergies  Family History: Family History  Problem Relation Age of Onset  . Heart disease Father     Social History:  reports that he has never smoked. His smokeless tobacco use includes snuff. He reports current alcohol use. He reports that he does not use drugs.  ROS: All other review of systems were reviewed and are negative except what is noted above in HPI  Physical Exam: BP 118/68   Pulse 74    Temp 98.1 F (36.7 C)   Ht 6' (1.829 m)   Wt 205 lb (93 kg)   BMI 27.80 kg/m   Constitutional:  Alert and oriented, No acute distress. HEENT: [place] AT, moist mucus membranes.  Trachea midline, no masses. Cardiovascular: No clubbing, cyanosis, or edema. Respiratory: Normal respiratory effort, no increased work of breathing. GI: Abdomen is soft, nontender, nondistended, no abdominal masses GU: No CVA tenderness Lymph: No cervical or inguinal lymphadenopathy. Skin: No rashes, bruises or suspicious lesions. Neurologic: Grossly intact, no focal deficits, moving all 4 extremities. Psychiatric: Normal mood and affect.  Laboratory Data: Lab Results  Component Value Date   WBC 7.7 05/24/2018   HGB 16.3 05/24/2018   HCT 46.5 05/24/2018   MCV 94 05/24/2018   PLT 215 05/24/2018    Lab Results  Component Value Date   CREATININE 1.13 05/24/2018    No results found for: PSA  Lab Results  Component Value Date   TESTOSTERONE 351 01/22/2020    No results found for: HGBA1C  Urinalysis No results found for: COLORURINE, APPEARANCEUR, LABSPEC, PHURINE, GLUCOSEU, HGBUR, BILIRUBINUR, KETONESUR, PROTEINUR, UROBILINOGEN, NITRITE, LEUKOCYTESUR  No results found for: LABMICR, [place], RBCUA, LABEPIT, MUCUS, BACTERIA  Pertinent Imaging:  No results found for this or any previous visit. No results found for this or  any previous visit. No results found for this or any previous visit. No results found for this or any previous visit. No results found for this or any previous visit. No results found for this or any previous visit. No results found for this or any previous visit. No results found for this or any previous visit.  Assessment & Plan:    1. Erectile dysfunction due to arterial insufficiency -continue tadalafil 5mg  daily and 20mg  prn  2. Lower extremity numbness -referral to Neurology   No follow-ups on file.  , MD  [facility] [place]

## 2020-02-05 NOTE — Progress Notes (Signed)

## 2020-03-26 ENCOUNTER — Ambulatory Visit: Payer: BLUE CROSS/BLUE SHIELD | Admitting: Neurology

## 2020-04-27 ENCOUNTER — Other Ambulatory Visit: Payer: Self-pay | Admitting: Urology

## 2020-05-13 ENCOUNTER — Ambulatory Visit: Payer: BC Managed Care – PPO | Admitting: Urology

## 2020-06-08 ENCOUNTER — Other Ambulatory Visit: Payer: Self-pay

## 2020-06-08 ENCOUNTER — Ambulatory Visit (INDEPENDENT_AMBULATORY_CARE_PROVIDER_SITE_OTHER): Payer: BLUE CROSS/BLUE SHIELD | Admitting: Urology

## 2020-06-08 ENCOUNTER — Encounter: Payer: Self-pay | Admitting: Urology

## 2020-06-08 VITALS — BP 106/66 | HR 72 | Temp 98.1°F | Ht 72.0 in | Wt 210.0 lb

## 2020-06-08 DIAGNOSIS — R200 Anesthesia of skin: Secondary | ICD-10-CM

## 2020-06-08 DIAGNOSIS — N5201 Erectile dysfunction due to arterial insufficiency: Secondary | ICD-10-CM

## 2020-06-08 MED ORDER — AMBULATORY NON FORMULARY MEDICATION: 0.2000 mL | 5 refills | Status: DC | PRN

## 2020-06-08 NOTE — Progress Notes (Signed)

## 2020-06-08 NOTE — Progress Notes (Signed)
06/08/2020 3:33 PM   Dorothey Baseman [facility] 07-21-1971 [id number]  Referring provider: Dettinger, Elige Radon, MD [address] [place],  [place] [address]  Erectile dysfunction  HPI: Andrew [name] is a 49yo here for followup for erectile dysfunction. He is currently on cialis 5mg  daily. He has issues maintaining the erection. His leg numbness resolved since last visit.  His LUTS have improved since last visit while on cialis 5mg  daily.  He has good libido. Testosterone and TSH are normal.   PMH: Past Medical History:  Diagnosis Date  . Acid reflux   . High cholesterol     Surgical History: Past Surgical History:  Procedure Laterality Date  . NO PAST SURGERIES      Home Medications:  Allergies as of 06/08/2020   No Known Allergies     Medication List       Accurate as of June 08, 2020  3:33 PM. If you have any questions, ask your nurse or doctor.        STOP taking these medications   clonazePAM 0.5 MG tablet Commonly known as: KLONOPIN Stopped by: 08/09/2020, MD   omeprazole 40 MG capsule Commonly known as: PRILOSEC Stopped by: June 10, 2020, MD     TAKE these medications   aspirin EC 81 MG tablet Take 81 mg by mouth daily.   Azelastine-Fluticasone 137-50 MCG/ACT Susp Commonly known as: Dymista Place 1-2 puffs into the nose at bedtime.   cyanocobalamin 2000 MCG tablet Take 2,000 mcg by mouth daily.   tadalafil 5 MG tablet Commonly known as: CIALIS Take 1 tablet by mouth once daily What changed: Another medication with the same name was removed. Continue taking this medication, and follow the directions you see here. Changed by: Wilkie Aye, MD       Allergies: No Known Allergies  Family History: Family History  Problem Relation Age of Onset  . Heart disease Father     Social History:  reports that he has never smoked. His smokeless tobacco use includes snuff. He reports current alcohol use. He reports that he does not use  drugs.  ROS: All other review of systems were reviewed and are negative except what is noted above in HPI  Physical Exam: BP 106/66   Pulse 72   Temp 98.1 F (36.7 C)   Ht 6' (1.829 m)   Wt 210 lb (95.3 kg)   BMI 28.48 kg/m   Constitutional:  Alert and oriented, No acute distress. HEENT: [place] AT, moist mucus membranes.  Trachea midline, no masses. Cardiovascular: No clubbing, cyanosis, or edema. Respiratory: Normal respiratory effort, no increased work of breathing. GI: Abdomen is soft, nontender, nondistended, no abdominal masses GU: No CVA tenderness.  Lymph: No cervical or inguinal lymphadenopathy. Skin: No rashes, bruises or suspicious lesions. Neurologic: Grossly intact, no focal deficits, moving all 4 extremities. Psychiatric: Normal mood and affect.  Laboratory Data: Lab Results  Component Value Date   WBC 7.7 05/24/2018   HGB 16.3 05/24/2018   HCT 46.5 05/24/2018   MCV 94 05/24/2018   PLT 215 05/24/2018    Lab Results  Component Value Date   CREATININE 1.13 05/24/2018    No results found for: PSA  Lab Results  Component Value Date   TESTOSTERONE 351 01/22/2020    No results found for: HGBA1C  Urinalysis No results found for: COLORURINE, APPEARANCEUR, LABSPEC, PHURINE, GLUCOSEU, HGBUR, BILIRUBINUR, KETONESUR, PROTEINUR, UROBILINOGEN, NITRITE, LEUKOCYTESUR  No results found for: LABMICR, WBCUA, RBCUA, LABEPIT, MUCUS, BACTERIA  Pertinent  Imaging:  No results found for this or any previous visit.  No results found for this or any previous visit.  No results found for this or any previous visit.  No results found for this or any previous visit.  No results found for this or any previous visit.  No results found for this or any previous visit.  No results found for this or any previous visit.  No results found for this or any previous visit.   Assessment & Plan:    1. Erectile dysfunction due to arterial insufficiency -We discussed continue  PDE5s, ICI, Muse, VED, and IPP. We will pursue trimix therapy. RTC 1-2 weeks for trimix teaching   No follow-ups on file.  Wilkie Aye, MD  [facility] [place]

## 2020-06-08 NOTE — Patient Instructions (Signed)
Erectile Dysfunction Erectile dysfunction (ED) is the inability to get or keep an erection in order to have sexual intercourse. Erectile dysfunction may include:  Inability to get an erection.  Lack of enough hardness of the erection to allow penetration.  Loss of the erection before sex is finished. What are the causes? This condition may be caused by:  Certain medicines, such as: ? Pain relievers. ? Antihistamines. ? Antidepressants. ? Blood pressure medicines. ? Water pills (diuretics). ? Ulcer medicines. ? Muscle relaxants. ? Drugs.  Excessive drinking.  Psychological causes, such as: ? Anxiety. ? Depression. ? Sadness. ? Exhaustion. ? Performance fear. ? Stress.  Physical causes, such as: ? Artery problems. This may include diabetes, smoking, liver disease, or atherosclerosis. ? High blood pressure. ? Hormonal problems, such as low testosterone. ? Obesity. ? Nerve problems. This may include back or pelvic injuries, diabetes mellitus, multiple sclerosis, or Parkinson disease. What are the signs or symptoms? Symptoms of this condition include:  Inability to get an erection.  Lack of enough hardness of the erection to allow penetration.  Loss of the erection before sex is finished.  Normal erections at some times, but with frequent unsatisfactory episodes.  Low sexual satisfaction in either partner due to erection problems.  A curved penis occurring with erection. The curve may cause pain or the penis may be too curved to allow for intercourse.  Never having nighttime erections. How is this diagnosed? This condition is often diagnosed by:  Performing a physical exam to find other diseases or specific problems with the penis.  Asking you detailed questions about the problem.  Performing blood tests to check for diabetes mellitus or to measure hormone levels.  Performing other tests to check for underlying health conditions.  Performing an ultrasound  exam to check for scarring.  Performing a test to check blood flow to the penis.  Doing a sleep study at home to measure nighttime erections. How is this treated? This condition may be treated by:  Medicine taken by mouth to help you achieve an erection (oral medicine).  Hormone replacement therapy to replace low testosterone levels.  Medicine that is injected into the penis. Your health care provider may instruct you how to give yourself these injections at home.  Vacuum pump. This is a pump with a ring on it. The pump and ring are placed on the penis and used to create pressure that helps the penis become erect.  Penile implant surgery. In this procedure, you may receive: ? An inflatable implant. This consists of cylinders, a pump, and a reservoir. The cylinders can be inflated with a fluid that helps to create an erection, and they can be deflated after intercourse. ? A semi-rigid implant. This consists of two silicone rubber rods. The rods provide some rigidity. They are also flexible, so the penis can both curve downward in its normal position and become straight for sexual intercourse.  Blood vessel surgery, to improve blood flow to the penis. During this procedure, a blood vessel from a different part of the body is placed into the penis to allow blood to flow around (bypass) damaged or blocked blood vessels.  Lifestyle changes, such as exercising more, losing weight, and quitting smoking. Follow these instructions at home: Medicines   Take over-the-counter and prescription medicines only as told by your health care provider. Do not increase the dosage without first discussing it with your health care provider.  If you are using self-injections, perform injections as directed by your   health care provider. Make sure to avoid any veins that are on the surface of the penis. After giving an injection, apply pressure to the injection site for 5 minutes. General  instructions  Exercise regularly, as directed by your health care provider. Work with your health care provider to lose weight, if needed.  Do not use any products that contain nicotine or tobacco, such as cigarettes and e-cigarettes. If you need help quitting, ask your health care provider.  Before using a vacuum pump, read the instructions that come with the pump and discuss any questions with your health care provider.  Keep all follow-up visits as told by your health care provider. This is important. Contact a health care provider if:  You feel nauseous.  You vomit. Get help right away if:  You are taking oral or injectable medicines and you have an erection that lasts longer than 4 hours. If your health care provider is unavailable, go to the nearest emergency room for evaluation. An erection that lasts much longer than 4 hours can result in permanent damage to your penis.  You have severe pain in your groin or abdomen.  You develop redness or severe swelling of your penis.  You have redness spreading up into your groin or lower abdomen.  You are unable to urinate.  You experience chest pain or a rapid heart beat (palpitations) after taking oral medicines. Summary  Erectile dysfunction (ED) is the inability to get or keep an erection during sexual intercourse. This problem can usually be treated successfully.  This condition is diagnosed based on a physical exam, your symptoms, and tests to determine the cause. Treatment varies depending on the cause, and may include medicines, hormone therapy, surgery, or vacuum pump.  You may need follow-up visits to make sure that you are using your medicines or devices correctly.  Get help right away if you are taking or injecting medicines and you have an erection that lasts longer than 4 hours. This information is not intended to replace advice given to you by your health care provider. Make sure you discuss any questions you have with  your health care provider. Document Revised: 10/27/2017 Document Reviewed: 11/30/2016 Elsevier Patient Education  2020 Elsevier Inc.  

## 2020-06-24 ENCOUNTER — Other Ambulatory Visit: Payer: Self-pay

## 2020-06-24 ENCOUNTER — Other Ambulatory Visit: Payer: Self-pay | Admitting: Urology

## 2020-06-24 ENCOUNTER — Ambulatory Visit (INDEPENDENT_AMBULATORY_CARE_PROVIDER_SITE_OTHER): Payer: BLUE CROSS/BLUE SHIELD | Admitting: Urology

## 2020-06-24 VITALS — BP 115/76 | HR 66 | Temp 98.6°F | Ht 72.0 in | Wt 205.0 lb

## 2020-06-24 DIAGNOSIS — N5201 Erectile dysfunction due to arterial insufficiency: Secondary | ICD-10-CM

## 2020-06-24 MED ORDER — AMBULATORY NON FORMULARY MEDICATION: 0.2000 mL | 5 refills | Status: DC | PRN

## 2020-06-24 NOTE — Progress Notes (Signed)
Patient rescheduled

## 2020-06-24 NOTE — Progress Notes (Signed)

## 2020-06-25 ENCOUNTER — Ambulatory Visit (INDEPENDENT_AMBULATORY_CARE_PROVIDER_SITE_OTHER): Payer: BLUE CROSS/BLUE SHIELD | Admitting: Urology

## 2020-06-25 VITALS — BP 108/68 | HR 73 | Temp 98.3°F | Ht 72.0 in | Wt 205.0 lb

## 2020-06-25 DIAGNOSIS — N5201 Erectile dysfunction due to arterial insufficiency: Secondary | ICD-10-CM

## 2020-07-07 ENCOUNTER — Encounter: Payer: Self-pay | Admitting: Urology

## 2020-07-07 NOTE — Patient Instructions (Signed)
Erectile Dysfunction Erectile dysfunction (ED) is the inability to get or keep an erection in order to have sexual intercourse. Erectile dysfunction may include:  Inability to get an erection.  Lack of enough hardness of the erection to allow penetration.  Loss of the erection before sex is finished. What are the causes? This condition may be caused by:  Certain medicines, such as: ? Pain relievers. ? Antihistamines. ? Antidepressants. ? Blood pressure medicines. ? Water pills (diuretics). ? Ulcer medicines. ? Muscle relaxants. ? Drugs.  Excessive drinking.  Psychological causes, such as: ? Anxiety. ? Depression. ? Sadness. ? Exhaustion. ? Performance fear. ? Stress.  Physical causes, such as: ? Artery problems. This may include diabetes, smoking, liver disease, or atherosclerosis. ? High blood pressure. ? Hormonal problems, such as low testosterone. ? Obesity. ? Nerve problems. This may include back or pelvic injuries, diabetes mellitus, multiple sclerosis, or Parkinson disease. What are the signs or symptoms? Symptoms of this condition include:  Inability to get an erection.  Lack of enough hardness of the erection to allow penetration.  Loss of the erection before sex is finished.  Normal erections at some times, but with frequent unsatisfactory episodes.  Low sexual satisfaction in either partner due to erection problems.  A curved penis occurring with erection. The curve may cause pain or the penis may be too curved to allow for intercourse.  Never having nighttime erections. How is this diagnosed? This condition is often diagnosed by:  Performing a physical exam to find other diseases or specific problems with the penis.  Asking you detailed questions about the problem.  Performing blood tests to check for diabetes mellitus or to measure hormone levels.  Performing other tests to check for underlying health conditions.  Performing an ultrasound  exam to check for scarring.  Performing a test to check blood flow to the penis.  Doing a sleep study at home to measure nighttime erections. How is this treated? This condition may be treated by:  Medicine taken by mouth to help you achieve an erection (oral medicine).  Hormone replacement therapy to replace low testosterone levels.  Medicine that is injected into the penis. Your health care provider may instruct you how to give yourself these injections at home.  Vacuum pump. This is a pump with a ring on it. The pump and ring are placed on the penis and used to create pressure that helps the penis become erect.  Penile implant surgery. In this procedure, you may receive: ? An inflatable implant. This consists of cylinders, a pump, and a reservoir. The cylinders can be inflated with a fluid that helps to create an erection, and they can be deflated after intercourse. ? A semi-rigid implant. This consists of two silicone rubber rods. The rods provide some rigidity. They are also flexible, so the penis can both curve downward in its normal position and become straight for sexual intercourse.  Blood vessel surgery, to improve blood flow to the penis. During this procedure, a blood vessel from a different part of the body is placed into the penis to allow blood to flow around (bypass) damaged or blocked blood vessels.  Lifestyle changes, such as exercising more, losing weight, and quitting smoking. Follow these instructions at home: Medicines   Take over-the-counter and prescription medicines only as told by your health care provider. Do not increase the dosage without first discussing it with your health care provider.  If you are using self-injections, perform injections as directed by your   health care provider. Make sure to avoid any veins that are on the surface of the penis. After giving an injection, apply pressure to the injection site for 5 minutes. General  instructions  Exercise regularly, as directed by your health care provider. Work with your health care provider to lose weight, if needed.  Do not use any products that contain nicotine or tobacco, such as cigarettes and e-cigarettes. If you need help quitting, ask your health care provider.  Before using a vacuum pump, read the instructions that come with the pump and discuss any questions with your health care provider.  Keep all follow-up visits as told by your health care provider. This is important. Contact a health care provider if:  You feel nauseous.  You vomit. Get help right away if:  You are taking oral or injectable medicines and you have an erection that lasts longer than 4 hours. If your health care provider is unavailable, go to the nearest emergency room for evaluation. An erection that lasts much longer than 4 hours can result in permanent damage to your penis.  You have severe pain in your groin or abdomen.  You develop redness or severe swelling of your penis.  You have redness spreading up into your groin or lower abdomen.  You are unable to urinate.  You experience chest pain or a rapid heart beat (palpitations) after taking oral medicines. Summary  Erectile dysfunction (ED) is the inability to get or keep an erection during sexual intercourse. This problem can usually be treated successfully.  This condition is diagnosed based on a physical exam, your symptoms, and tests to determine the cause. Treatment varies depending on the cause, and may include medicines, hormone therapy, surgery, or vacuum pump.  You may need follow-up visits to make sure that you are using your medicines or devices correctly.  Get help right away if you are taking or injecting medicines and you have an erection that lasts longer than 4 hours. This information is not intended to replace advice given to you by your health care provider. Make sure you discuss any questions you have with  your health care provider. Document Revised: 10/27/2017 Document Reviewed: 11/30/2016 Elsevier Patient Education  2020 Elsevier Inc.  

## 2020-07-07 NOTE — Progress Notes (Signed)
06/25/2020 8:09 AM   Andrew [name] 1971/09/20 [id number]  Referring provider: Dettinger, Elige Radon, MD [address] [place],  [place] [address]  Erectile dysfunction  HPI: Andrew [name] is a 49yo here for followup for erectile dysfunction. He has failed PDE5s. He is here for trimix teaching   PMH: Past Medical History:  Diagnosis Date  . Acid reflux   . High cholesterol     Surgical History: Past Surgical History:  Procedure Laterality Date  . NO PAST SURGERIES      Home Medications:  Allergies as of 06/25/2020   No Known Allergies     Medication List       Accurate as of June 25, 2020 11:59 PM. If you have any questions, ask your nurse or doctor.        AMBULATORY NON FORMULARY MEDICATION 0.2 mLs by Intracavernosal route as needed. Medication Name: Trimix  PGE Pap 30mg  Phent 1mg    AMBULATORY NON FORMULARY MEDICATION 0.2 mLs by Intracavernosal route as needed. Medication Name: Trimix  PGE Pap 30mg  Phent 1mg    aspirin EC 81 MG tablet Take 81 mg by mouth daily.   Azelastine-Fluticasone 137-50 MCG/ACT Susp Commonly known as: Dymista Place 1-2 puffs into the nose at bedtime.   cyanocobalamin 2000 MCG tablet Take 2,000 mcg by mouth daily.   tadalafil 5 MG tablet Commonly known as: CIALIS Take 1 tablet by mouth once daily       Allergies: No Known Allergies  Family History: Family History  Problem Relation Age of Onset  . Heart disease Father     Social History:  reports that he has never smoked. His smokeless tobacco use includes snuff. He reports current alcohol use. He reports that he does not use drugs.  ROS: All other review of systems were reviewed and are negative except what is noted above in HPI  Physical Exam: BP 108/68   Pulse 73   Temp 98.3 F (36.8 C)   Ht 6' (1.829 m)   Wt (!) 205 lb (93 kg)   BMI 27.80 kg/m   Constitutional:  Alert and oriented, No acute distress. HEENT: [place] AT, moist mucus membranes.   Trachea midline, no masses. Cardiovascular: No clubbing, cyanosis, or edema. Respiratory: Normal respiratory effort, no increased work of breathing. GI: Abdomen is soft, nontender, nondistended, no abdominal masses GU: No CVA tenderness.  Lymph: No cervical or inguinal lymphadenopathy. Skin: No rashes, bruises or suspicious lesions. Neurologic: Grossly intact, no focal deficits, moving all 4 extremities. Psychiatric: Normal mood and affect.  Laboratory Data: Lab Results  Component Value Date   WBC 7.7 05/24/2018   HGB 16.3 05/24/2018   HCT 46.5 05/24/2018   MCV 94 05/24/2018   PLT 215 05/24/2018    Lab Results  Component Value Date   CREATININE 1.13 05/24/2018    No results found for: PSA  Lab Results  Component Value Date   TESTOSTERONE 351 01/22/2020    No results found for: HGBA1C  Urinalysis No results found for: COLORURINE, APPEARANCEUR, LABSPEC, PHURINE, GLUCOSEU, HGBUR, BILIRUBINUR, KETONESUR, PROTEINUR, UROBILINOGEN, NITRITE, LEUKOCYTESUR  No results found for: LABMICR, WBCUA, RBCUA, LABEPIT, MUCUS, BACTERIA  Pertinent Imaging:  No results found for this or any previous visit.  No results found for this or any previous visit.  No results found for this or any previous visit.  No results found for this or any previous visit.  No results found for this or any previous visit.  No results found for  this or any previous visit.  No results found for this or any previous visit.  No results found for this or any previous visit.  Trimix injection instruction:  Patient instructed to draw 0.9ml of trimix into the insulin syringe. I then instructed him to inject at the mid penile shaft at either the 3 or 9 o'clock position. I then injected the patient and he achieved a good erection in 20 minutes. Penile injection completed.   Assessment & Plan:    1. Erectile dysfunction due to arterial insufficiency -Patient had a good erection with 0.69ml trimix. Patient  instructed on titrating dose. He is instructed to call the office if he has erection lasts more than 4 hours   No follow-ups on file.  Wilkie Aye, MD  [facility] [place]

## 2020-08-13 ENCOUNTER — Ambulatory Visit: Payer: BLUE CROSS/BLUE SHIELD | Admitting: Neurology

## 2020-08-13 ENCOUNTER — Other Ambulatory Visit: Payer: Self-pay

## 2020-08-13 ENCOUNTER — Encounter: Payer: Self-pay | Admitting: Neurology

## 2020-08-13 ENCOUNTER — Other Ambulatory Visit: Payer: Self-pay | Admitting: Urology

## 2020-08-13 VITALS — BP 109/67 | HR 63 | Ht 72.0 in | Wt 212.0 lb

## 2020-08-13 DIAGNOSIS — G2581 Restless legs syndrome: Secondary | ICD-10-CM

## 2020-08-13 DIAGNOSIS — R202 Paresthesia of skin: Secondary | ICD-10-CM

## 2020-08-13 HISTORY — DX: Restless legs syndrome: G25.81

## 2020-08-13 MED ORDER — PRAMIPEXOLE DIHYDROCHLORIDE 0.125 MG PO TABS: 0.1250 mg | ORAL_TABLET | Freq: Every day | ORAL | 1 refills | Status: DC

## 2020-08-13 NOTE — Progress Notes (Signed)
Reason for visit: Transient paresthesias  Referring physician: Dr. Broadus John Erven [name] is a 49 y.o. male  History of present illness:  Andrew [name] is a 49 year old right-handed white male with a history of sleep apnea on CPAP.  The patient indicates that about 3 months ago he began having episodes of numbness involving the right leg that may occur while sitting or with lying down in bed.  This may wake him up at night at times.  If he gets up and moves around, the numbness and tingling goes away.  The patient noted that a month ago the right leg problem went away completely and has not recurred.  Beginning a couple weeks ago, he began having similar problems with the left arm.  The patient will wake up at night with numbness in the fingers of the left hand or with numbness of the left arm, if he moves about, this will improve.  He denies any clumsiness or weakness of the arm.  The numbness when it did affect the right leg would include the entire leg and again without any weakness or clumsiness.  He denies any neck pain or low back pain or pain down in the arms or legs.  He has not had any numbness involving the right arm or the left leg.  He denies any numbness on the body or numbness involving the face.  He denies any speech changes or vision changes or difficulty with swallowing.  He gives a history of restless leg syndrome that is a nightly occurrence, he has never taken any medications for this.  He does have a lot of trouble getting to sleep at night, he has a lot of fatigue during the day.  He comes to the office today for an evaluation.  Past Medical History:  Diagnosis Date  . Acid reflux   . High cholesterol     Past Surgical History:  Procedure Laterality Date  . NO PAST SURGERIES      Family History  Problem Relation Age of Onset  . Heart disease Father     Social history:  reports that he has never smoked. His smokeless tobacco use includes snuff. He reports  current alcohol use. He reports that he does not use drugs.  Medications:  Prior to Admission medications   Medication Sig Start Date End Date Taking? Authorizing Provider  AMBULATORY NON FORMULARY MEDICATION 0.2 mLs by Intracavernosal route as needed. Medication Name: Trimix  PGE Pap 30mg  Phent 1mg  06/24/20  Yes McKenzie, , MD  AMBULATORY NON FORMULARY MEDICATION 0.2 mLs by Intracavernosal route as needed. Medication Name: Trimix  PGE 06/26/20 Pap 30mg  Phent 1mg  06/24/20  Yes McKenzie, , MD  aspirin EC 81 MG tablet Take 81 mg by mouth daily.   Yes [provider]  cyanocobalamin 2000 MCG tablet Take 2,000 mcg by mouth daily.   Yes [provider]  tadalafil (CIALIS) 5 MG tablet Take 1 tablet by mouth once daily 04/29/20  Yes McKenzie, , MD  Azelastine-Fluticasone (DYMISTA) 137-50 MCG/ACT SUSP Place 1-2 puffs into the nose at bedtime. Patient not taking: Reported on 12/04/2019 01/07/19 01/07/20  Mardene Celeste D, MD     No Known Allergies  ROS:  Out of a complete 14 system review of symptoms, the patient complains only of the following symptoms, and all other reviewed systems are negative.  Transient numbness Restless legs Insomnia  Blood pressure 109/67, pulse 63, height 6' (1.829 m), weight 212  lb (96.2 kg).  Physical Exam  General: The patient is alert and cooperative at the time of the examination.  Eyes: Pupils are equal, round, and reactive to light. Discs are flat bilaterally.  Neck: The neck is supple, no carotid bruits are noted.  Respiratory: The respiratory examination is clear.  Cardiovascular: The cardiovascular examination reveals a regular rate and rhythm, no obvious murmurs or rubs are noted.  Neuromuscular: Range move the cervical and lumbar spine is full.  Skin: Extremities are without significant edema.  Neurologic Exam  Mental status: The patient is alert and oriented x 3 at the time of the examination. The  patient has apparent normal recent and remote memory, with an apparently normal attention span and concentration ability.  Cranial nerves: Facial symmetry is present. There is good sensation of the face to pinprick and soft touch bilaterally. The strength of the facial muscles and the muscles to head turning and shoulder shrug are normal bilaterally. Speech is well enunciated, no aphasia or dysarthria is noted. Extraocular movements are full. Visual fields are full. The tongue is midline, and the patient has symmetric elevation of the soft palate. No obvious hearing deficits are noted.  Motor: The motor testing reveals 5 over 5 strength of all 4 extremities. Good symmetric motor tone is noted throughout.  Sensory: Sensory testing is intact to pinprick, soft touch, vibration sensation, and position sense on all 4 extremities. No evidence of extinction is noted.  Coordination: Cerebellar testing reveals good finger-nose-finger and heel-to-shin bilaterally.  Gait and station: Gait is normal. Tandem gait is normal. Romberg is negative. No drift is seen.  Reflexes: Deep tendon reflexes are symmetric and normal bilaterally. Toes are downgoing bilaterally.   Assessment/Plan:  1.  Transient paresthesias, right leg and left arm  2.  Restless leg syndrome  The patient will be given Mirapex in low-dose for the restless leg syndrome.  The etiology of the transient numbness of the left arm and right leg is unclear.  The patient will be set up for nerve conduction studies of the right leg and the left arm, EMG of the left arm will be done.  He will follow-up for the above study.  [name] MD 08/13/2020 4:03 PM  [facility] [address] [place], [place] [address]  Phone [phone] Fax [phone]

## 2020-08-13 NOTE — Patient Instructions (Signed)
We will try Mirapex for the restless legs.

## 2020-08-19 ENCOUNTER — Ambulatory Visit (INDEPENDENT_AMBULATORY_CARE_PROVIDER_SITE_OTHER): Payer: BLUE CROSS/BLUE SHIELD | Admitting: Family Medicine

## 2020-08-19 ENCOUNTER — Other Ambulatory Visit: Payer: Self-pay

## 2020-08-19 ENCOUNTER — Encounter: Payer: Self-pay | Admitting: Family Medicine

## 2020-08-19 VITALS — BP 107/70 | HR 75 | Temp 97.0°F | Ht 72.0 in | Wt 211.0 lb

## 2020-08-19 DIAGNOSIS — G629 Polyneuropathy, unspecified: Secondary | ICD-10-CM | POA: Diagnosis not present

## 2020-08-19 NOTE — Progress Notes (Signed)
BP 107/70   Pulse 75   Temp (!) 97 F (36.1 C)   Ht 6' (1.829 m)   Wt 95.7 kg   SpO2 98%   BMI 28.62 kg/m    Subjective:   Patient ID: Andrew [name], male    DOB: 04-11-1971, 49 y.o.   MRN: [id number]  HPI: Andrew [name] is a 48 y.o. male presenting on 08/19/2020 for Numbness (left arm and left hand finger tips)   HPI   Patient presented to the office for evaluation of left arm and hand numbness for 2 weeks. Patient states he was lying flat and was awoken from sleep with numbness in his left upper extremity. He denies any trauma or known injury. He states that if he props his head up on a pillow, his symptoms lesson but then he does not get a good night sleep. He saw a neurologist on 9/16 and was found to have restless leg syndrome and started on Mirapex and scheduled for an UE EMG. Patient states he sometimes feels like the numbness stays with him all day and he loses feeling in his fingertips but denies no issues with strength. Patient also endorses and increase in migraine headaches that starts in his neck and moves to the temples associated with photophobia. He denies any weakness, pain, or issues on the right UE.    Relevant past medical, surgical, family and social history reviewed and updated as indicated. Interim medical history since our last visit reviewed. Allergies and medications reviewed and updated.  Review of Systems  Constitutional: Negative for chills, fatigue and fever.  Respiratory: Negative for cough, chest tightness and shortness of breath.   Cardiovascular: Negative for chest pain, palpitations and leg swelling.  Gastrointestinal: Negative for abdominal pain, diarrhea, nausea and vomiting.  Genitourinary: Positive for decreased urine volume.  Skin: Negative for color change and pallor.  Neurological: Positive for numbness and headaches. Negative for tremors and weakness.    Per HPI unless specifically indicated above   Allergies as of  08/19/2020   No Known Allergies     Medication List       Accurate as of August 19, 2020 10:49 AM. If you have any questions, ask your nurse or doctor.        AMBULATORY NON FORMULARY MEDICATION 0.2 mLs by Intracavernosal route as needed. Medication Name: Trimix  PGE Pap 30mg  Phent 1mg    AMBULATORY NON FORMULARY MEDICATION 0.2 mLs by Intracavernosal route as needed. Medication Name: Trimix  PGE Pap 30mg  Phent 1mg    aspirin EC 81 MG tablet Take 81 mg by mouth daily.   Azelastine-Fluticasone 137-50 MCG/ACT Susp Commonly known as: Dymista Place 1-2 puffs into the nose at bedtime.   cyanocobalamin 2000 MCG tablet Take 2,000 mcg by mouth daily.   pramipexole 0.125 MG tablet Commonly known as: Mirapex Take 1 tablet (0.125 mg total) by mouth at bedtime.   tadalafil 5 MG tablet Commonly known as: CIALIS Take 1 tablet by mouth once daily        Objective:   BP 107/70   Pulse 75   Temp (!) 97 F (36.1 C)   Ht 6' (1.829 m)   Wt 95.7 kg   SpO2 98%   BMI 28.62 kg/m   Wt Readings from Last 3 Encounters:  08/19/20 95.7 kg  08/13/20 96.2 kg  06/25/20 (!) 93 kg    Physical Exam Constitutional:      General: He is not in acute distress.  Appearance: Normal appearance.  HENT:     Head: Normocephalic.  Cardiovascular:     Rate and Rhythm: Normal rate and regular rhythm.     Pulses: Normal pulses.     Heart sounds: Normal heart sounds. No murmur heard.  No friction rub. No gallop.   Pulmonary:     Effort: Pulmonary effort is normal.     Breath sounds: Normal breath sounds. No wheezing, rhonchi or rales.  Chest:     Chest wall: No tenderness.  Abdominal:     General: There is no distension.     Palpations: Abdomen is soft.     Tenderness: There is no abdominal tenderness.  Musculoskeletal:        General: No tenderness or signs of injury. Normal range of motion.     Cervical back: Normal range of motion and neck supple. No rigidity or  tenderness.  Skin:    General: Skin is warm and dry.  Neurological:     General: No focal deficit present.     Mental Status: He is alert and oriented to person, place, and time. Mental status is at baseline.     Sensory: No sensory deficit.     Motor: No weakness.     Deep Tendon Reflexes: Reflexes normal.       Assessment & Plan:   Problem List Items Addressed This Visit    None    Visit Diagnoses    Neuropathy    -  Primary   left arm      Plan: Patient's condition was discussed with him and the following plan was determined:    1.  We recommend taking ibuprofen 400-600mg  by mouth once in the morning and once at night to relieve symptoms. If they do not improve we discussed starting Gabapentin.    2. Patient has an EMG scheduled with neurology.   Plan was discussed with Dr. Arville Care, MD who is in agreement with stated plan.  Earl Gala PA-S    Follow up plan: Return if symptoms worsen or fail to improve.  Counseling provided for all of the vaccine components No orders of the defined types were placed in this encounter.  Patient seen and examined with Earl Gala PA student. We will manage conservatively neurology Arville Care, MD [facility] 08/19/2020, 10:49 AM

## 2020-09-07 ENCOUNTER — Encounter: Payer: BLUE CROSS/BLUE SHIELD | Admitting: Neurology

## 2020-09-21 ENCOUNTER — Ambulatory Visit: Payer: BLUE CROSS/BLUE SHIELD | Admitting: Urology

## 2020-09-23 ENCOUNTER — Other Ambulatory Visit: Payer: Self-pay

## 2020-09-23 ENCOUNTER — Ambulatory Visit (INDEPENDENT_AMBULATORY_CARE_PROVIDER_SITE_OTHER): Payer: BLUE CROSS/BLUE SHIELD | Admitting: Neurology

## 2020-09-23 ENCOUNTER — Encounter: Payer: Self-pay | Admitting: Neurology

## 2020-09-23 ENCOUNTER — Ambulatory Visit: Payer: BLUE CROSS/BLUE SHIELD | Admitting: Neurology

## 2020-09-23 DIAGNOSIS — G5602 Carpal tunnel syndrome, left upper limb: Secondary | ICD-10-CM | POA: Insufficient documentation

## 2020-09-23 DIAGNOSIS — R202 Paresthesia of skin: Secondary | ICD-10-CM | POA: Diagnosis not present

## 2020-09-23 HISTORY — DX: Carpal tunnel syndrome, left upper limb: G56.02

## 2020-09-23 NOTE — Progress Notes (Addendum)
The patient comes in for EMG nerve conduction study evaluation.  He has episodes of numbness of the left hand and arm at nighttime, occasional symptoms during the day.  He has been evaluated for possible neuropathy or a radiculopathy.  Nerve conductions show evidence of a mild left carpal tunnel syndrome, EMG of the left arm was unremarkable.  He is taking Mirapex for restless leg syndrome at night which seems to help, he does not take the medication on a regular basis.  The patient is to get a wrist splint and wear on the left hand is much as possible over the next 6 to 8 weeks, if no improvement in symptoms he is to call us and we will make a referral to a hand surgeon.    MNC    Nerve / Sites Muscle Latency Ref. Amplitude Ref. Rel Amp Segments Distance Velocity Ref. Area    ms ms mV mV %  cm m/s m/s mVms  L Median - APB     Wrist APB 5.5 ?4.4 4.1 ?4.0 100 Wrist - APB 7   12.4     Upper arm APB 9.8  3.6  88.2 Upper arm - Wrist 24 56 ?49 12.4  L Ulnar - ADM     Wrist ADM 2.8 ?3.3 7.1 ?6.0 100 Wrist - ADM 7   24.0     B.Elbow ADM 7.2  5.9  83 B.Elbow - Wrist 25 57 ?49 21.1     A.Elbow ADM 9.1  5.9  99.4 A.Elbow - B.Elbow 10 54 ?49 21.4         A.Elbow - Wrist      R Peroneal - EDB     Ankle EDB 4.2 ?6.5 7.7 ?2.0 100 Ankle - EDB 9   22.3     Fib head EDB 10.9  6.7  87.8 Fib head - Ankle 31 46 ?44 21.4     Pop fossa EDB 13.2  6.2  92.3 Pop fossa - Fib head 10 45 ?44 20.0         Pop fossa - Ankle      R Tibial - AH     Ankle AH 3.8 ?5.8 9.6 ?4.0 100 Ankle - AH 9   20.8     Pop fossa AH 13.8  7.1  73.9 Pop fossa - Ankle 41 41 ?41 18.3             SNC    Nerve / Sites Rec. Site Peak Lat Ref.  Amp Ref. Segments Distance    ms ms V V  cm  R Sural - Ankle (Calf)     Calf Ankle 3.7 ?4.4 7 ?6 Calf - Ankle 14  R Superficial peroneal - Ankle     Lat leg Ankle 4.1 ?4.4 6 ?6 Lat leg - Ankle 14  L Median - Orthodromic (Dig II, Mid palm)     Dig II Wrist 4.2 ?3.4 10 ?10 Dig II - Wrist 13   L Ulnar - Orthodromic, (Dig V, Mid palm)     Dig V Wrist 2.8 ?3.1 12 ?5 Dig V - Wrist 55             F  Wave    Nerve F Lat Ref.   ms ms  R Tibial - AH 55.6 ?56.0  L Ulnar - ADM 30.9 ?32.0

## 2020-09-23 NOTE — Procedures (Signed)
° ° ° °  HISTORY:  Andrew [name] is a 49 year old patient with a history of episodes of left hand and arm numbness that occurs at nighttime.  The patient is being evaluated for possible neuropathy or a cervical radiculopathy.  NERVE CONDUCTION STUDIES:  Nerve conduction studies were performed on the left upper extremity.  The distal motor latency for the left median nerve was prolonged with a normal motor amplitude.  Distal motor latencies and motor amplitudes for the left ulnar nerve were normal.  Nerve conduction velocities for the left median and ulnar nerves were normal.  The left median sensory latency was prolonged, and was normal for the left ulnar nerve.  The F-wave latency for the left ulnar nerve was normal.  Nerve conduction studies were performed on the right lower extremity. The distal motor latencies and motor amplitudes for the peroneal and posterior tibial nerves were within normal limits. The nerve conduction velocities for these nerves were also normal. The sensory latencies for the peroneal and sural nerves were within normal limits. The F wave latency for the posterior tibial nerve was within normal limits.   EMG STUDIES:  EMG study was performed on the left upper extremity:  The first dorsal interosseous muscle reveals 2 to 4 K units with full recruitment. No fibrillations or positive waves were noted. The abductor pollicis brevis muscle reveals 2 to 4 K units with full recruitment. No fibrillations or positive waves were noted. The extensor indicis proprius muscle reveals 1 to 3 K units with full recruitment. No fibrillations or positive waves were noted. The pronator teres muscle reveals 2 to 3 K units with full recruitment. No fibrillations or positive waves were noted. The biceps muscle reveals 1 to 2 K units with full recruitment. No fibrillations or positive waves were noted. The triceps muscle reveals 2 to 4 K units with full recruitment. No fibrillations or positive  waves were noted. The anterior deltoid muscle reveals 2 to 3 K units with full recruitment. No fibrillations or positive waves were noted. The cervical paraspinal muscles were tested at 2 levels. No abnormalities of insertional activity were seen at either level tested. There was good relaxation.   IMPRESSION:  Nerve conduction studies done on the left upper extremity and the right lower extremity do not show evidence of generalized neuropathy.  There is evidence of a mild left carpal tunnel syndrome.  EMG study of the left upper extremity was unremarkable.  No evidence of an overlying cervical radiculopathy was seen.  [name] MD 09/23/2020 2:58 PM  [facility] [address] [place], [place] [address]  Phone [phone] Fax [phone]

## 2020-09-23 NOTE — Progress Notes (Signed)
Please refer to EMG and nerve conduction procedure note.  

## 2020-11-17 ENCOUNTER — Other Ambulatory Visit: Payer: Self-pay | Admitting: Urology

## 2021-01-08 DIAGNOSIS — R6889 Other general symptoms and signs: Secondary | ICD-10-CM | POA: Diagnosis not present

## 2021-01-08 DIAGNOSIS — R059 Cough, unspecified: Secondary | ICD-10-CM | POA: Diagnosis not present

## 2021-01-08 DIAGNOSIS — J029 Acute pharyngitis, unspecified: Secondary | ICD-10-CM | POA: Diagnosis not present

## 2021-01-08 DIAGNOSIS — R509 Fever, unspecified: Secondary | ICD-10-CM | POA: Diagnosis not present

## 2021-01-08 DIAGNOSIS — J020 Streptococcal pharyngitis: Secondary | ICD-10-CM | POA: Diagnosis not present

## 2021-02-15 NOTE — Progress Notes (Signed)
HPI- male former smoker followed for OSA, complicated by GERD NPSG 03/27/16-AHI 40.1/hour, desaturation to 85%, CPAP titration to 9, body weight 200 pounds  --------------------------------------------------------------------   01/07/2019- 50 year old male former smoker followed for OSA, complicated by GERD,  CPAP 5-12 auto/Advanced  Uses different machine at his girlfriend's house. Clonazepam,   Cardiology has evaluated him for chest pain. Download compliance 50% (reflecting use of 2 machines as noted), AHI 1.8/hour Occasionally CPAP wakes him that he takes it off in his sleep-still finds it bothersome but recognizes he sleeps better using it.  We discussed mask fit and comfort issues. Likes Dymista for management of postnasal drainage. Has urology problems-nocturia, ED.  I suggested he go back to the urologist he had seen before.  02/17/20- 50 year old male former smoker followed for OSA, complicated by GERD, Restless Legs, CPAP 5-12 auto/Advanced  Uses different machine at his girlfriend's house. Clonazepam,  Mirapex,  Download-compliance 63%, AHI 2.2/ hr Body weight today-210 lbs Covid vax-none Flu vax-no --------Patient wants to know if he can try ambien again at a low dose. Patient states that he goes to bed tired and has been waking up tired. States his girlfriend has been checking his oxygen and he has been in the 80's   ROS-see HPI   + = positive Constitutional:    weight loss, night sweats, fevers, chills,  + fatigue, lassitude. HEENT:    headaches, difficulty swallowing, tooth/dental problems, sore throat,       sneezing, itching, ear ache, + nasal congestion, post nasal drip, snoring CV:    chest pain, orthopnea, PND, swelling in lower extremities, anasarca,                                                   dizziness, palpitations Resp:   shortness of breath with exertion or at rest.                productive cough,   non-productive cough, coughing up of blood.               change in color of mucus.  wheezing.   Skin:    rash or lesions. GI:  No-   heartburn, indigestion, abdominal pain, nausea, vomiting, diarrhea,                 change in bowel habits, loss of appetite GU: No-dysuria, change in color of urine, no urgency or frequency.   flank pain. MS:   joint pain, stiffness, decreased range of motion, back pain. Neuro-     nothing unusual Psych:  change in mood or affect.  depression or anxiety.   memory loss.  OBJ- Physical Exam   stable baseline exam-fit-looking General- Alert, Oriented, Affect-appropriate, Distress- none acute, not overweight Skin- rash-none, lesions- none, excoriation- none Lymphadenopathy- none Head- atraumatic            Eyes- Gross vision intact, PERRLA, conjunctivae and secretions clear            Ears- Hearing, canals-normal            Nose- Clear, no-Septal dev, mucus, polyps, erosion, perforation             Throat- Mallampati III-IV , mucosa clear , drainage- none, tonsils- atrophic Neck- flexible , trachea midline, no stridor , thyroid nl, carotid no bruit Chest - symmetrical excursion ,  unlabored           Heart/CV- RRR , no murmur , no gallop  , no rub, nl s1 s2                           - JVD- none , edema- none, stasis changes- none, varices- none           Lung- clear to P&A, wheeze- none, cough- none , dullness-none, rub- none           Chest wall-  Abd-  Br/ Gen/ Rectal- Not done, not indicated Extrem- cyanosis- none, clubbing, none, atrophy- none, strength- nl Neuro- grossly intact to observation

## 2021-02-16 ENCOUNTER — Ambulatory Visit (INDEPENDENT_AMBULATORY_CARE_PROVIDER_SITE_OTHER): Payer: 59 | Admitting: Internal Medicine

## 2021-02-16 ENCOUNTER — Other Ambulatory Visit: Payer: Self-pay

## 2021-02-16 ENCOUNTER — Encounter: Payer: Self-pay | Admitting: Internal Medicine

## 2021-02-16 ENCOUNTER — Other Ambulatory Visit: Payer: Self-pay | Admitting: Urology

## 2021-02-16 VITALS — BP 110/70 | HR 73 | Temp 98.1°F | Ht 72.0 in | Wt 210.2 lb

## 2021-02-16 DIAGNOSIS — F5101 Primary insomnia: Secondary | ICD-10-CM

## 2021-02-16 DIAGNOSIS — G4733 Obstructive sleep apnea (adult) (pediatric): Secondary | ICD-10-CM | POA: Diagnosis not present

## 2021-02-16 DIAGNOSIS — R69 Illness, unspecified: Secondary | ICD-10-CM | POA: Diagnosis not present

## 2021-02-16 MED ORDER — ZOLPIDEM TARTRATE 5 MG PO TABS: ORAL_TABLET | ORAL | 5 refills | Status: DC

## 2021-02-16 NOTE — Patient Instructions (Addendum)
Order- DME [employer] please change auto range to 4-15, continue mask of choice, humidifier, supplies, AirView/ card  You can try turning your humidifier up a bit to help with dry mouth You can talk with [employer] about cause for the red frown faces on your machine in the morning.  Script sent for ambien 5 mg- try 1/2 or 1 tab  Order- DME [employer]- schedule overnight oximetry  Dx nocturnal hypoxemia  Try otc Afrin nasal decongestant spray, just 1 puff each nostril, just at bedtime.

## 2021-03-03 ENCOUNTER — Telehealth: Payer: Self-pay | Admitting: Internal Medicine

## 2021-03-03 MED ORDER — AZELASTINE-FLUTICASONE 137-50 MCG/ACT NA SUSP: 1.0000 | Freq: Two times a day (BID) | NASAL | 12 refills | Status: DC

## 2021-03-03 NOTE — Telephone Encounter (Signed)
Called and spoke with patient about recs from Dr. Maple Hudson. He verified pharmacy and expressed understanding. RX sent to pharmacy. Nothing further needed at this time.

## 2021-03-03 NOTE — Telephone Encounter (Signed)
Called and spoke with patient who states that he's been using nasal spray at night time as prescribed. States around 2:00 pm in the afternoon he gets a stuffy nose, but not draining. States that he was on a prescription nasal spray before and think that he didn't give it enough time like you recommended. Pharmacy is [facility].    Dr. Maple Hudson please advise

## 2021-03-03 NOTE — Telephone Encounter (Signed)
Question is unclear.If he wants to try Dymista nasal spray again then ok to reorder (azelastine-fluticasone)     # 1, refill x 12  1 puff reach nostril twice daily while needed.

## 2021-05-20 ENCOUNTER — Ambulatory Visit: Payer: 59 | Admitting: Internal Medicine

## 2021-06-16 ENCOUNTER — Encounter: Payer: Self-pay | Admitting: Internal Medicine

## 2021-06-16 NOTE — Patient Instructions (Addendum)
Order- refer to [facility]   second opinion requested with different doctor to discuss ED, Low T  Order- referral to [facility] [place]  Chronic nasal obstruction  Script sent to try Adderal 10 mg up to twice daily if needed for energy  Suggest you try otc Flonase/ fluticasone nasal spray. This is gentle and gradual. Use it daily for a week to see if it helps your nose stay open.   Consider trying otc Breathe Right nasal strips for stuffy nose as needed.

## 2021-06-16 NOTE — Assessment & Plan Note (Signed)
Discussed sleep hygiene Ok to try [place] instead of clonazepam

## 2021-06-16 NOTE — Assessment & Plan Note (Signed)
Controlled with Mirapex. 

## 2021-06-16 NOTE — Assessment & Plan Note (Addendum)
Using 2 machcines in different locations, but set the same and seems to sleep ok. Sleep not restorative. Discussed options. Plan- trial Adderall 10 mg oncee or twice daily. Discuss possibility of "low T" with Urology.

## 2021-06-16 NOTE — Progress Notes (Addendum)
HPI- male former smoker followed for OSA, complicated by GERD NPSG 03/27/16-AHI 40.1/hour, desaturation to 85%, CPAP titration to 9, body weight 200 pounds  --------------------------------------------------------------------   02/17/20- 50 year old male former smoker followed for OSA, complicated by GERD, Restless Legs, CPAP 5-12 auto/Advanced  Uses different machine at his girlfriend's house. Clonazepam,  Mirapex,  Download-compliance 63%, AHI 2.2/ hr Body weight today-210 lbs Covid vax-none Flu vax-no --------Patient wants to know if he can try ambien again at a low dose. Patient states that he goes to bed tired and has been waking up tired. States his girlfriend has been checking his oxygen and he has been in the 57's   02/16/21-50 year old male former smoker followed for OSA, complicated by GERD, Restless Legs, CPAP 5-12 auto/Advanced  Uses different machine at his girlfriend's house. -Ambien 5 Mirapex,  Download-compliance 63%, AHI 2.2/ hr Body weight today-210 lbs Covid vax-none Flu vax-no --------Patient wants to know if he can try ambien again at a low dose. Patient states that he goes to bed tired and has been waking up tired. States his girlfriend has been checking his oxygen and he has been in the 80's  We had suggested trial Afrin and turn up humidifier. He was going to talk with [employer] about "frown" messages on machine.     06/17/21- 50 year old male former smoker followed for OSA, complicated by GERD, Restless Legs, CPAP 4-15  auto/[employer]  Uses different machine at his girlfriend's house, set the same -Clonazepam,  Mirapex,  Download- compliance 67% on this machine (1 of 2), AHI 1.3/ hr Body weight today-212 lbs Covid vax, set the same We had ordered overnight oximetry> not qualified for sleep O2 Not snoring through CPAP per girlfirend, but she tells him he is tired in day and cross. Admits he works hard. Even when he sleeps well, he feels unrested. Still using 2  machines, set the same. Afrin worked well for nasal congestion, understands can't use regularly. Concerned Cialis not adequate. Asks about Urology referral to different doctor. Raised question of "low T".   ROS-see HPI   + = positive Constitutional:    weight loss, night sweats, fevers, chills,  + fatigue, lassitude. HEENT:    headaches, difficulty swallowing, tooth/dental problems, sore throat,       sneezing, itching, ear ache, + nasal congestion, post nasal drip, snoring CV:    chest pain, orthopnea, PND, swelling in lower extremities, anasarca,                                                   dizziness, palpitations Resp:   shortness of breath with exertion or at rest.                productive cough,   non-productive cough, coughing up of blood.              change in color of mucus.  wheezing.   Skin:    rash or lesions. GI:  No-   heartburn, indigestion, abdominal pain, nausea, vomiting, diarrhea,                 change in bowel habits, loss of appetite GU: No-dysuria, change in color of urine, no urgency or frequency.   flank pain. MS:   joint pain, stiffness, decreased range of motion, back pain. Neuro-     nothing unusual Psych:  change in mood or affect.  depression or anxiety.   memory loss.  OBJ- Physical Exam   stable baseline exam-fit-looking General- Alert, Oriented, Affect-appropriate, Distress- none acute, not overweight Skin- rash-none, lesions- none, excoriation- none Lymphadenopathy- none Head- atraumatic            Eyes- Gross vision intact, PERRLA, conjunctivae and secretions clear            Ears- Hearing, canals-normal            Nose- Clear, no-Septal dev, mucus, polyps, erosion, perforation             Throat- Mallampati III-IV , mucosa clear , drainage- none, tonsils- atrophic Neck- flexible , trachea midline, no stridor , thyroid nl, carotid no bruit Chest - symmetrical excursion , unlabored           Heart/CV- RRR , no murmur , no gallop  , no rub, nl  s1 s2                           - JVD- none , edema- none, stasis changes- none, varices- none           Lung- clear to P&A, wheeze- none, cough- none , dullness-none, rub- none           Chest wall-  Abd-  Br/ Gen/ Rectal- Not done, not indicated Extrem- cyanosis- none, clubbing, none, atrophy- none, strength- nl Neuro- grossly intact to observation

## 2021-06-16 NOTE — Assessment & Plan Note (Signed)
Discussed change to auto 5-12 Benefits from CPAP Plan- auto 5-12

## 2021-06-17 ENCOUNTER — Other Ambulatory Visit: Payer: Self-pay

## 2021-06-17 ENCOUNTER — Encounter: Payer: Self-pay | Admitting: Internal Medicine

## 2021-06-17 ENCOUNTER — Ambulatory Visit: Payer: 59 | Admitting: Internal Medicine

## 2021-06-17 ENCOUNTER — Other Ambulatory Visit: Payer: Self-pay | Admitting: Urology

## 2021-06-17 DIAGNOSIS — J310 Chronic rhinitis: Secondary | ICD-10-CM

## 2021-06-17 DIAGNOSIS — G2581 Restless legs syndrome: Secondary | ICD-10-CM | POA: Diagnosis not present

## 2021-06-17 DIAGNOSIS — N529 Male erectile dysfunction, unspecified: Secondary | ICD-10-CM | POA: Diagnosis not present

## 2021-06-17 DIAGNOSIS — G4733 Obstructive sleep apnea (adult) (pediatric): Secondary | ICD-10-CM | POA: Diagnosis not present

## 2021-06-17 MED ORDER — AMPHETAMINE-DEXTROAMPHETAMINE 10 MG PO TABS: ORAL_TABLET | ORAL | 0 refills | Status: DC

## 2021-06-17 NOTE — Addendum Note (Signed)
Addended by: Jetty Duhamel D on: 06/17/2021 04:08 PM   Modules accepted: Orders

## 2021-06-17 NOTE — Assessment & Plan Note (Signed)
Referred to Urology

## 2021-06-17 NOTE — Assessment & Plan Note (Signed)
Dymista too expensive and unhelpful. Afrin works great but limited by rebound risk. Plan- ENT referral to assess nasal airway., try Flonase

## 2021-06-17 NOTE — Addendum Note (Signed)
Addended byRennis Petty on: 06/17/2021 04:17 PM   Modules accepted: Orders

## 2022-03-21 ENCOUNTER — Ambulatory Visit: Payer: 59 | Admitting: Urology

## 2022-03-21 ENCOUNTER — Encounter: Payer: Self-pay | Admitting: Urology

## 2022-03-21 VITALS — BP 111/71 | HR 73

## 2022-03-21 DIAGNOSIS — N5201 Erectile dysfunction due to arterial insufficiency: Secondary | ICD-10-CM

## 2022-03-21 DIAGNOSIS — N138 Other obstructive and reflux uropathy: Secondary | ICD-10-CM

## 2022-03-21 MED ORDER — TADALAFIL 5 MG PO TABS: 5.0000 mg | ORAL_TABLET | Freq: Every day | ORAL | 3 refills | Status: DC

## 2022-03-21 MED ORDER — TADALAFIL 20 MG PO TABS: 20.0000 mg | ORAL_TABLET | Freq: Every day | ORAL | 5 refills | Status: DC | PRN

## 2022-03-21 NOTE — Patient Instructions (Signed)
Erectile Dysfunction ?Erectile dysfunction (ED) is the inability to get or keep an erection in order to have sexual intercourse. ED is considered a symptom of an underlying disorder and is not considered a disease. ED may include: ?Inability to get an erection. ?Lack of enough hardness of the erection to allow penetration. ?Loss of erection before sex is finished. ?What are the causes? ?This condition may be caused by: ?Physical causes, such as: ?Artery problems. This may include heart disease, high blood pressure, atherosclerosis, and diabetes. ?Hormonal problems, such as low testosterone. ?Obesity. ?Nerve problems. This may include back or pelvic injuries, multiple sclerosis, Parkinson's disease, spinal cord injury, and stroke. ?Certain medicines, such as: ?Pain relievers. ?Antidepressants. ?Blood pressure medicines and water pills (diuretics). ?Cancer medicines. ?Antihistamines. ?Muscle relaxants. ?Lifestyle factors, such as: ?Use of drugs such as marijuana, cocaine, or opioids. ?Excessive use of alcohol. ?Smoking. ?Lack of physical activity or exercise. ?Psychological causes, such as: ?Anxiety or stress. ?Sadness or depression. ?Exhaustion. ?Fear about sexual performance. ?Guilt. ?What are the signs or symptoms? ?Symptoms of this condition include: ?Inability to get an erection. ?Lack of enough hardness of the erection to allow penetration. ?Loss of the erection before sex is finished. ?Sometimes having normal erections, but with frequent unsatisfactory episodes. ?Low sexual satisfaction in either partner due to erection problems. ?A curved penis occurring with erection. The curve may cause pain, or the penis may be too curved to allow for intercourse. ?Never having nighttime or morning erections. ?How is this diagnosed? ?This condition is often diagnosed by: ?Performing a physical exam to find other diseases or specific problems with the penis. ?Asking you detailed questions about the problem. ?Doing tests,  such as: ?Blood tests to check for diabetes mellitus or high cholesterol, or to measure hormone levels. ?Other tests to check for underlying health conditions. ?An ultrasound exam to check for scarring. ?A test to check blood flow to the penis. ?Doing a sleep study at home to measure nighttime erections. ?How is this treated? ?This condition may be treated by: ?Medicines, such as: ?Medicine taken by mouth to help you achieve an erection (oral medicine). ?Hormone replacement therapy to replace low testosterone levels. ?Medicine that is injected into the penis. Your health care provider may instruct you how to give yourself these injections at home. ?Medicine that is delivered with a short applicator tube. The tube is inserted into the opening at the tip of the penis, which is the opening of the urethra. A tiny pellet of medicine is put in the urethra. The pellet dissolves and enhances erectile function. This is also called MUSE (medicated urethral system for erections) therapy. ?Vacuum pump. This is a pump with a ring on it. The pump and ring are placed on the penis and used to create pressure that helps the penis become erect. ?Penile implant surgery. In this procedure, you may receive: ?An inflatable implant. This consists of cylinders, a pump, and a reservoir. The cylinders can be inflated with a fluid that helps to create an erection, and they can be deflated after intercourse. ?A semi-rigid implant. This consists of two silicone rubber rods. The rods provide some rigidity. They are also flexible, so the penis can both curve downward in its normal position and become straight for sexual intercourse. ?Blood vessel surgery to improve blood flow to the penis. During this procedure, a blood vessel from a different part of the body is placed into the penis to allow blood to flow around (bypass) damaged or blocked blood vessels. ?Lifestyle changes,   such as exercising more, losing weight, and quitting smoking. ?Follow  these instructions at home: ?Medicines ? ?Take over-the-counter and prescription medicines only as told by your health care provider. Do not increase the dosage without first discussing it with your health care provider. ?If you are using self-injections, do injections as directed by your health care provider. Make sure you avoid any veins that are on the surface of the penis. After giving an injection, apply pressure to the injection site for 5 minutes. ?Talk to your health care provider about how to prevent headaches while taking ED medicines. These medicines may cause a sudden headache due to the increase in blood flow in your body. ?General instructions ?Exercise regularly, as directed by your health care provider. Work with your health care provider to lose weight, if needed. ?Do not use any products that contain nicotine or tobacco. These products include cigarettes, chewing tobacco, and vaping devices, such as e-cigarettes. If you need help quitting, ask your health care provider. ?Before using a vacuum pump, read the instructions that come with the pump and discuss any questions with your health care provider. ?Keep all follow-up visits. This is important. ?Contact a health care provider if: ?You feel nauseous. ?You are vomiting. ?You get sudden headaches while taking ED medicines. ?You have any concerns about your sexual health. ?Get help right away if: ?You are taking oral or injectable medicines and you have an erection that lasts longer than 4 hours. If your health care provider is unavailable, go to the nearest emergency room for evaluation. An erection that lasts much longer than 4 hours can result in permanent damage to your penis. ?You have severe pain in your groin or abdomen. ?You develop redness or severe swelling of your penis. ?You have redness spreading at your groin or lower abdomen. ?You are unable to urinate. ?You experience chest pain or a rapid heartbeat (palpitations) after taking oral  medicines. ?These symptoms may represent a serious problem that is an emergency. Do not wait to see if the symptoms will go away. Get medical help right away. Call your local emergency services (911 in the [place] Do not drive yourself to the hospital. ?Summary ?Erectile dysfunction (ED) is the inability to get or keep an erection during sexual intercourse. ?This condition is diagnosed based on a physical exam, your symptoms, and tests to determine the cause. Treatment varies depending on the cause and may include medicines, hormone therapy, surgery, or a vacuum pump. ?You may need follow-up visits to make sure that you are using your medicines or devices correctly. ?Get help right away if you are taking or injecting medicines and you have an erection that lasts longer than 4 hours. ?This information is not intended to replace advice given to you by your health care provider. Make sure you discuss any questions you have with your health care provider. ?Document Revised: 02/10/2021 Document Reviewed: 02/10/2021 ?Elsevier Patient Education ? 2023 [employer] ? ?

## 2022-03-21 NOTE — Progress Notes (Signed)
? ?03/21/2022 ?3:44 PM  ? ?Andrew [name] ?1970/12/21 ?[id number] ? ?Referring provider: Dettinger, Fransisca Kaufmann, MD ?[address] ?[place],  [place] [address] ? ?Followup erectile dysfunction ? ? ?HPI: ?Andrew [name] is a 51yo here for followup for erectile dysfunction. He uses tadalafil 5mg  daily which works well for his erectile dysfunction. Good libido. No significant LUTS. IPSS 0 QOL 0. No other complaints today ? ? ?PMH: ?Past Medical History:  ?Diagnosis Date  ? Acid reflux   ? High cholesterol   ? Left carpal tunnel syndrome 09/23/2020  ? Restless legs syndrome 08/13/2020  ? ? ?Surgical History: ?Past Surgical History:  ?Procedure Laterality Date  ? NO PAST SURGERIES    ? ? ?Home Medications:  ?Allergies as of 03/21/2022   ?No Known Allergies ?  ? ?  ?Medication List  ?  ? ?  ? Accurate as of March 21, 2022  3:44 PM. If you have any questions, ask your nurse or doctor.  ?  ?  ? ?  ? ?AMBULATORY NON FORMULARY MEDICATION ?0.2 mLs by Intracavernosal route as needed. Medication Name: Trimix ? ?PGE 93mcg ?Pap 30mg  ?Phent 1mg  ?  ?AMBULATORY NON FORMULARY MEDICATION ?0.2 mLs by Intracavernosal route as needed. Medication Name: Trimix ? ?PGE 42mcg ?Pap 30mg  ?Phent 1mg  ?  ?amphetamine-dextroamphetamine 10 MG tablet ?Commonly known as: Adderall ?1 tablet, once or twice daily as needed ?  ?aspirin EC 81 MG tablet ?Take 81 mg by mouth daily. ?  ?Azelastine-Fluticasone 137-50 MCG/ACT Susp ?Commonly known as: Dymista ?Place 1 puff into both nostrils 2 (two) times daily. ?  ?cyanocobalamin 2000 MCG tablet ?Take 2,000 mcg by mouth daily. ?  ?tadalafil 5 MG tablet ?Commonly known as: CIALIS ?Take 1 tablet by mouth once daily ?  ?zolpidem 5 MG tablet ?Commonly known as: AMBIEN ?one half or one tab for sleep if needed ?  ? ?  ? ? ?Allergies: No Known Allergies ? ?Family History: ?Family History  ?Problem Relation Age of Onset  ? Heart disease Father   ? ? ?Social History:  reports that he has never smoked. His smokeless tobacco use  includes snuff. He reports current alcohol use. He reports that he does not use drugs. ? ?ROS: ?All other review of systems were reviewed and are negative except what is noted above in HPI ? ?Physical Exam: ?BP 111/71   Pulse 73   ?Constitutional:  Alert and oriented, No acute distress. ?HEENT: [name], moist mucus membranes.  Trachea midline, no masses. ?Cardiovascular: No clubbing, cyanosis, or edema. ?Respiratory: Normal respiratory effort, no increased work of breathing. ?GI: Abdomen is soft, nontender, nondistended, no abdominal masses ?GU: No CVA tenderness.  ?Lymph: No cervical or inguinal lymphadenopathy. ?Skin: No rashes, bruises or suspicious lesions. ?Neurologic: Grossly intact, no focal deficits, moving all 4 extremities. ?Psychiatric: Normal mood and affect. ? ?Laboratory Data: ?Lab Results  ?Component Value Date  ? WBC 7.7 05/24/2018  ? HGB 16.3 05/24/2018  ? HCT 46.5 05/24/2018  ? MCV 94 05/24/2018  ? PLT 215 05/24/2018  ? ? ?Lab Results  ?Component Value Date  ? CREATININE 1.13 05/24/2018  ? ? ?No results found for: PSA ? ?Lab Results  ?Component Value Date  ? TESTOSTERONE 351 01/22/2020  ? ? ?No results found for: HGBA1C ? ?Urinalysis ?No results found for: COLORURINE, APPEARANCEUR, [place], [place], [place], [place], [place], KETONESUR, PROTEINUR, [place], NITRITE, LEUKOCYTESUR ? ?No results found for: LABMICR, [place], RBCUA, LABEPIT, MUCUS, BACTERIA ? ?Pertinent Imaging: ? ?No results found for this  or any previous visit. ? ?No results found for this or any previous visit. ? ?No results found for this or any previous visit. ? ?No results found for this or any previous visit. ? ?No results found for this or any previous visit. ? ?No results found for this or any previous visit. ? ?No results found for this or any previous visit. ? ?No results found for this or any previous visit. ? ? ?Assessment & Plan:   ? ?1. Erectile dysfunction due to arterial insufficiency ?-Tadalafil 5mg   daily ?-Tadalafil 20mg  PRN ? ? ? ? ?No follow-ups on file. ? ?Nicolette Bang, MD ? ?[facility] ?  ?

## 2022-05-16 ENCOUNTER — Encounter: Payer: Self-pay | Admitting: Family Medicine

## 2022-05-16 ENCOUNTER — Ambulatory Visit: Payer: 59 | Admitting: Family Medicine

## 2022-05-16 VITALS — BP 117/74 | HR 70 | Temp 98.0°F

## 2022-05-16 DIAGNOSIS — R079 Chest pain, unspecified: Secondary | ICD-10-CM

## 2022-05-16 DIAGNOSIS — R0602 Shortness of breath: Secondary | ICD-10-CM | POA: Diagnosis not present

## 2022-05-16 NOTE — Progress Notes (Signed)
BP 117/74   Pulse 70   Temp 98 F (36.7 C)   SpO2 96%    Subjective:   Patient ID: Andrew [name], male    DOB: 25-Apr-1971, 51 y.o.   MRN: [id number]  HPI: Andrew [name] is a 51 y.o. male presenting on 05/16/2022 for Shortness of Breath and Chest Pain   HPI Patient is coming in today with chest pain and tightness and feelings of shortness of breath is been going on over the past couple weeks.  Patient says that its been worse over the past few days.  His energy has been down as well.  Sometimes feels short of breath sitting and sometimes up around chest discomfort or tightness on the left side of his chest.  He says it has been more constant over the past 2 days but not anything severe.  He says he still did go to work today and He did not feel short of breath when he was working today.  He had a stress test 4 years ago.  Relevant past medical, surgical, family and social history reviewed and updated as indicated. Interim medical history since our last visit reviewed. Allergies and medications reviewed and updated.  Review of Systems  Constitutional:  Negative for chills and fever.  Respiratory:  Positive for chest tightness and shortness of breath. Negative for wheezing.   Cardiovascular:  Negative for chest pain, palpitations and leg swelling.  Musculoskeletal:  Negative for back pain and gait problem.  Skin:  Negative for rash.  Neurological:  Negative for dizziness, weakness and light-headedness.  All other systems reviewed and are negative.   Per HPI unless specifically indicated above   Allergies as of 05/16/2022   No Known Allergies      Medication List        Accurate as of May 16, 2022  4:55 PM. If you have any questions, ask your nurse or doctor.          STOP taking these medications    AMBULATORY NON FORMULARY MEDICATION Stopped by: Worthy Rancher, MD   AMBULATORY NON FORMULARY MEDICATION Stopped by: Worthy Rancher, MD    amphetamine-dextroamphetamine 10 MG tablet Commonly known as: Adderall Stopped by: Worthy Rancher, MD   aspirin EC 81 MG tablet Stopped by: Worthy Rancher, MD   Azelastine-Fluticasone [phone] MCG/ACT Susp Commonly known as: Dymista Stopped by: Worthy Rancher, MD   cyanocobalamin 2000 MCG tablet Stopped by: Worthy Rancher, MD   zolpidem 5 MG tablet Commonly known as: AMBIEN Stopped by: Worthy Rancher, MD       TAKE these medications    tadalafil 5 MG tablet Commonly known as: CIALIS Take 1 tablet (5 mg total) by mouth daily. What changed: Another medication with the same name was removed. Continue taking this medication, and follow the directions you see here. Changed by: Fransisca Kaufmann Svetlana Bagby, MD         Objective:   BP 117/74   Pulse 70   Temp 98 F (36.7 C)   SpO2 96%   Wt Readings from Last 3 Encounters:  06/17/21 212 lb (96.2 kg)  02/16/21 210 lb 3.2 oz (95.3 kg)  08/19/20 211 lb (95.7 kg)    Physical Exam Vitals and nursing note reviewed.  Constitutional:      General: He is not in acute distress.    Appearance: He is well-developed. He is not diaphoretic.  Eyes:     General: No scleral icterus.  Right eye: No discharge.     Conjunctiva/sclera: Conjunctivae normal.     Pupils: Pupils are equal, round, and reactive to light.  Neck:     Thyroid: No thyromegaly.  Cardiovascular:     Rate and Rhythm: Normal rate and regular rhythm.     Heart sounds: Normal heart sounds. No murmur heard. Pulmonary:     Effort: Pulmonary effort is normal. No respiratory distress.     Breath sounds: Normal breath sounds. No wheezing.  Musculoskeletal:        General: Normal range of motion.     Cervical back: Neck supple.  Lymphadenopathy:     Cervical: No cervical adenopathy.  Skin:    General: Skin is warm and dry.     Findings: No rash.  Neurological:     Mental Status: He is alert and oriented to person, place, and time.      Coordination: Coordination normal.  Psychiatric:        Behavior: Behavior normal.     EKG: Normal sinus rhythm, heart rate 63.  No ST or Q-wave abnormality.  Assessment & Plan:   Problem List Items Addressed This Visit       Other   Chest pain - Primary   Relevant Orders   EKG 12-Lead (Completed)   Ambulatory referral to Cardiology   CBC with Differential/Platelet   CMP14+EGFR   Other Visit Diagnoses     Shortness of breath       Relevant Orders   EKG 12-Lead (Completed)   Ambulatory referral to Cardiology   CBC with Differential/Platelet   CMP14+EGFR     Patient did have an NM myocardial effusion and stress test which came back normal 4 years ago but based off of symptoms and risk factors including sleep apnea and family history I would recommend for him to go back for evaluation.  Follow up plan: Return if symptoms worsen or fail to improve.  Counseling provided for all of the vaccine components Orders Placed This Encounter  Procedures   CBC with Differential/Platelet   CMP14+EGFR   Ambulatory referral to Cardiology   EKG 12-Lead    Caryl Pina, MD [facility] 05/16/2022, 4:55 PM

## 2022-05-17 LAB — CBC WITH DIFFERENTIAL/PLATELET
Basophils Absolute: 0.1 10*3/uL (ref 0.0–0.2)
Basos: 1 %
EOS (ABSOLUTE): 0.1 10*3/uL (ref 0.0–0.4)
Eos: 1 %
Hematocrit: 48.3 % (ref 37.5–51.0)
Hemoglobin: 17.2 g/dL (ref 13.0–17.7)
Immature Grans (Abs): 0 10*3/uL (ref 0.0–0.1)
Immature Granulocytes: 0 %
Lymphocytes Absolute: 2.7 10*3/uL (ref 0.7–3.1)
Lymphs: 27 %
MCH: 33 pg (ref 26.6–33.0)
MCHC: 35.6 g/dL (ref 31.5–35.7)
MCV: 93 fL (ref 79–97)
Monocytes Absolute: 0.8 10*3/uL (ref 0.1–0.9)
Monocytes: 9 %
Neutrophils Absolute: 6 10*3/uL (ref 1.4–7.0)
Neutrophils: 62 %
Platelets: 222 10*3/uL (ref 150–450)
RBC: 5.21 x10E6/uL (ref 4.14–5.80)
RDW: 12.4 % (ref 11.6–15.4)
WBC: 9.7 10*3/uL (ref 3.4–10.8)

## 2022-05-17 LAB — CMP14+EGFR
ALT: 20 IU/L (ref 0–44)
AST: 17 IU/L (ref 0–40)
Albumin/Globulin Ratio: 1.7 (ref 1.2–2.2)
Albumin: 4.8 g/dL (ref 4.0–5.0)
Alkaline Phosphatase: 87 IU/L (ref 44–121)
BUN/Creatinine Ratio: 14 (ref 9–20)
BUN: 16 mg/dL (ref 6–24)
Bilirubin Total: 0.3 mg/dL (ref 0.0–1.2)
CO2: 25 mmol/L (ref 20–29)
Calcium: 9.7 mg/dL (ref 8.7–10.2)
Chloride: 98 mmol/L (ref 96–106)
Creatinine, Ser: 1.15 mg/dL (ref 0.76–1.27)
Globulin, Total: 2.9 g/dL (ref 1.5–4.5)
Glucose: 92 mg/dL (ref 70–99)
Potassium: 3.9 mmol/L (ref 3.5–5.2)
Sodium: 137 mmol/L (ref 134–144)
Total Protein: 7.7 g/dL (ref 6.0–8.5)
eGFR: 78 mL/min/{1.73_m2} (ref >59)

## 2022-05-27 ENCOUNTER — Encounter: Payer: Self-pay | Admitting: Internal Medicine

## 2022-05-27 ENCOUNTER — Ambulatory Visit: Payer: 59 | Admitting: Internal Medicine

## 2022-05-27 VITALS — BP 110/62 | HR 74 | Ht 72.0 in | Wt 214.0 lb

## 2022-05-27 DIAGNOSIS — Z720 Tobacco use: Secondary | ICD-10-CM | POA: Diagnosis not present

## 2022-05-27 DIAGNOSIS — G4733 Obstructive sleep apnea (adult) (pediatric): Secondary | ICD-10-CM

## 2022-05-27 DIAGNOSIS — Z8249 Family history of ischemic heart disease and other diseases of the circulatory system: Secondary | ICD-10-CM | POA: Diagnosis not present

## 2022-05-27 DIAGNOSIS — R072 Precordial pain: Secondary | ICD-10-CM | POA: Diagnosis not present

## 2022-05-27 MED ORDER — METOPROLOL TARTRATE 100 MG PO TABS: ORAL_TABLET | ORAL | 0 refills | Status: DC

## 2022-05-27 NOTE — Progress Notes (Signed)
Cardiology Office Note:    Date:  05/27/2022   ID:  Andrew [name], DOB 1971-07-26, MRN [id number]  PCP:  Dettinger, Elige Radon, MD   [facility] Providers Cardiologist:  None     Referring MD: Dettinger, Elige Radon, MD   CC: fatigue Consulted for the evaluation of CP at the behest of Dr. Louanne Skye  History of Present Illness:    Andrew [name] is a 51 y.o. male with a hx of of tobacco abuse (snuff) and FHX early CAD who presents with chest pain.  Patient notes that he is feeling new shortness of breath.   Four years ago was having acid reflux with  Was last feeling well as early as 2022. 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  Feet when out from under him.  Thinks he pulled a muscle.  Patient exertion notable for playing softball with daughter and has DOE. No shortness of breath at rest.  No PND or orthopnea.  No weight gain, leg swelling , or abdominal swelling.  No syncope or near syncope . Notes  no palpitations or funny heart beats.     Patient reports prior cardiac testing including 2019 has TID with apical artifact but with excellent exercise tolerance.   Past Medical History:  Diagnosis Date   Acid reflux    High cholesterol    Left carpal tunnel syndrome 09/23/2020   Restless legs syndrome 08/13/2020    Past Surgical History:  Procedure Laterality Date   NO PAST SURGERIES      Current Medications: Current Meds  Medication Sig   tadalafil (CIALIS) 5 MG tablet Take 1 tablet (5 mg total) by mouth daily.     Allergies:   Patient has no known allergies.   Social History   Socioeconomic History   Marital status: Divorced    Spouse name: Not on file   Number of children: 3   Years of education: Not on file   Highest education level: Not on file  Occupational History   Occupation: Location manager  Tobacco Use   Smoking status: Never   Smokeless tobacco: Current    Types:  Snuff   Tobacco comments:    1 can a day  Vaping Use   Vaping Use: Never used  Substance and Sexual Activity   Alcohol use: Yes    Alcohol/week: 0.0 standard drinks of alcohol    Comment: occasionally-beer   Drug use: No   Sexual activity: Not on file  Other Topics Concern   Not on file  Social History Narrative   Not on file   Social Determinants of Health   Financial Resource Strain: Not on file  Food Insecurity: Not on file  Transportation Needs: Not on file  Physical Activity: Not on file  Stress: Not on file  Social Connections: Not on file     Family History: The patient's family history includes Heart disease in his father.  In his 30s  ROS:   Please see the history of present illness.    All other systems reviewed and are negative.  EKGs/Labs/Other Studies Reviewed:    The following studies were reviewed today:  EKG:   05/16/22: NSR  Recent Labs: 05/16/2022: ALT 20; BUN 16; Creatinine, Ser 1.15; Hemoglobin 17.2; Platelets 222; Potassium 3.9; Sodium 137  Recent Lipid Panel    Component Value Date/Time   CHOL 161 05/24/2018 1627   TRIG 185 (H) 05/24/2018  1627   HDL 34 (L) 05/24/2018 1627   CHOLHDL 4.7 05/24/2018 1627   LDLCALC 90 05/24/2018 1627        Physical Exam:    VS:  BP 110/62   Pulse 74   Ht 6' (1.829 m)   Wt 214 lb (97.1 kg)   SpO2 96%   BMI 29.02 kg/m     Wt Readings from Last 3 Encounters:  05/27/22 214 lb (97.1 kg)  06/17/21 212 lb (96.2 kg)  02/16/21 210 lb 3.2 oz (95.3 kg)     GEN:  Well nourished, well developed in no acute distress HEENT: Normal NECK: No JVD LYMPHATICS: No lymphadenopathy CARDIAC: RRR, no murmurs, rubs, gallops RESPIRATORY:  Clear to auscultation without rales, wheezing or rhonchi  ABDOMEN: Soft, non-tender, non-distended MUSCULOSKELETAL:  No edema; No deformity  SKIN: Warm and dry NEUROLOGIC:  Alert and oriented x 3 PSYCHIATRIC:  Normal affect   ASSESSMENT:    1. Precordial chest pain   2. OSA  (obstructive sleep apnea)   3. Tobacco use   4. Family history of early CAD    PLAN:    Precordial Pain OSA Snuff Use Early history of CAD in family - Would recommend CCTA with possible FFR as needed to exclude obstructive CAD and to assess for non-obstructive CAD requiring secondary prevention - resting heart rate is 65-80 bpm, given BP room with add Metoprolol  100 mg PO 90 min prior to scan - tobacco cessation - will see me in 4-5 months for primary prevention unless CADRADS 0 or obstructive disease    Medication Adjustments/Labs and Tests Ordered: Current medicines are reviewed at length with the patient today.  Concerns regarding medicines are outlined above.  No orders of the defined types were placed in this encounter.  No orders of the defined types were placed in this encounter.   Patient Instructions  Medication Instructions: Your physician recommends that you continue on your current medications as directed. Please refer to the Current Medication list given to you today.    Take Lopressor 100 mg 2 hours before Cardiac CT   Labwork: None today   Testing/Procedures: Your physician has requested that you have cardiac CT. Cardiac computed tomography (CT) is a painless test that uses an x-ray machine to take clear, detailed pictures of your heart. For further information please visit [email or web address] Please follow instruction sheet as given.    Follow-Up: 4-5 months  Any Other Special Instructions Will Be Listed Below (If Applicable).  If you need a refill on your cardiac medications before your next appointment, please call your pharmacy.    Signed, Christell Constant, MD  05/27/2022 2:40 PM    [facility]

## 2022-05-27 NOTE — Patient Instructions (Addendum)
Medication Instructions: Your physician recommends that you continue on your current medications as directed. Please refer to the Current Medication list given to you today.    Take Lopressor 100 mg 2 hours before Cardiac CT   Labwork: None today   Testing/Procedures: Your physician has requested that you have cardiac CT. Cardiac computed tomography (CT) is a painless test that uses an x-ray machine to take clear, detailed pictures of your heart. For further information please visit [email or web address] Please follow instruction sheet as given.    Follow-Up: 4-5 months  Any Other Special Instructions Will Be Listed Below (If Applicable).  If you need a refill on your cardiac medications before your next appointment, please call your pharmacy.        Your cardiac CT will be scheduled at one of the below locations:   [facility] [address] [place], [place] [address] [phone]   If scheduled at [facility], please arrive at the [facility] and Children's Entrance (Entrance C2) of [facility] 30 minutes prior to test start time. You can use the FREE valet parking offered at entrance C (encouraged to control the heart rate for the test)  Proceed to the [facility] Radiology Department (first floor) to check-in and test prep.  All radiology patients and guests should use entrance C2 at [facility], accessed from [facility], even though the hospital's physical address listed is [address].      Please follow these instructions carefully (unless otherwise directed):  Hold all erectile dysfunction medications at least 3 days (72 hrs) prior to test.  On the Night Before the Test: Be sure to Drink plenty of water. Do not consume any caffeinated/decaffeinated beverages or chocolate 12 hours prior to your test. Do not take any antihistamines 12 hours prior to your test.  On the Day of the Test: Drink plenty  of water until 1 hour prior to the test. Do not eat any food 4 hours prior to the test. You may take your regular medications prior to the test.  Take metoprolol (Lopressor) two hours prior to test.         After the Test: Drink plenty of water. After receiving IV contrast, you may experience a mild flushed feeling. This is normal. On occasion, you may experience a mild rash up to 24 hours after the test. This is not dangerous. If this occurs, you can take Benadryl 25 mg and increase your fluid intake. If you experience trouble breathing, this can be serious. If it is severe call 911 IMMEDIATELY. If it is mild, please call our office. If you take any of these medications: Glipizide/Metformin, Avandament, Glucavance, please do not take 48 hours after completing test unless otherwise instructed.  We will call to schedule your test 2-4 weeks out understanding that some insurance companies will need an authorization prior to the service being performed.   For non-scheduling related questions, please contact the cardiac imaging nurse navigator should you have any questions/concerns: Rockwell [place], Cardiac Imaging Nurse Navigator Larey Brick, Cardiac Imaging Nurse Navigator [facility] Direct Office Dial: [phone]   For scheduling needs, including cancellations and rescheduling, please call [place], [phone].

## 2022-06-02 ENCOUNTER — Other Ambulatory Visit: Payer: Self-pay

## 2022-06-02 ENCOUNTER — Telehealth: Payer: Self-pay | Admitting: Internal Medicine

## 2022-06-02 DIAGNOSIS — R079 Chest pain, unspecified: Secondary | ICD-10-CM

## 2022-06-02 DIAGNOSIS — R072 Precordial pain: Secondary | ICD-10-CM

## 2022-06-02 NOTE — Telephone Encounter (Signed)
Pt's mother is calling to find out when he will have a test set up to check for any blockages. She was told that this would be set up for them. Please advise.

## 2022-06-02 NOTE — Telephone Encounter (Signed)
Please schedule. Thank you

## 2022-06-09 ENCOUNTER — Other Ambulatory Visit (HOSPITAL_COMMUNITY)
Admission: RE | Admit: 2022-06-09 | Discharge: 2022-06-09 | Disposition: A | Discharge status: Home / Self Care | Payer: 59 | Source: Ambulatory Visit | Attending: Internal Medicine | Admitting: Internal Medicine

## 2022-06-09 DIAGNOSIS — R072 Precordial pain: Secondary | ICD-10-CM | POA: Diagnosis not present

## 2022-06-09 LAB — BASIC METABOLIC PANEL
Anion gap: 6 (ref 5–15)
BUN: 14 mg/dL (ref 6–20)
CO2: 28 mmol/L (ref 22–32)
Calcium: 9 mg/dL (ref 8.9–10.3)
Chloride: 104 mmol/L (ref 98–111)
Creatinine, Ser: 1.15 mg/dL (ref 0.61–1.24)
GFR, Estimated: >60 mL/min (ref >60)
Glucose, Bld: 113 mg/dL — ABNORMAL HIGH (ref 70–99)
Potassium: 3.4 mmol/L — ABNORMAL LOW (ref 3.5–5.1)
Sodium: 138 mmol/L (ref 135–145)

## 2022-06-22 ENCOUNTER — Telehealth (HOSPITAL_COMMUNITY): Payer: Self-pay | Admitting: *Deleted

## 2022-06-22 NOTE — Telephone Encounter (Signed)
Reaching out to patient to offer assistance regarding upcoming cardiac imaging study; pt verbalizes understanding of appt date/time, parking situation and where to check in, pre-test NPO status and medications ordered, and verified current allergies; name and call back number provided for further questions should they arise  Larey Brick RN Navigator Cardiac Imaging [facility] [phone] office [phone] cell  Patient aware to hold cialis and to take 100mg  metoprolol tartrate two hours prior to his cardiac CT scan. He is aware to arrive at 3pm.

## 2022-06-24 ENCOUNTER — Ambulatory Visit (HOSPITAL_COMMUNITY)
Admission: RE | Admit: 2022-06-24 | Discharge: 2022-06-24 | Disposition: A | Discharge status: Home / Self Care | Payer: 59 | Source: Ambulatory Visit | Attending: Internal Medicine | Admitting: Internal Medicine

## 2022-06-24 DIAGNOSIS — R079 Chest pain, unspecified: Secondary | ICD-10-CM | POA: Insufficient documentation

## 2022-06-24 DIAGNOSIS — R072 Precordial pain: Secondary | ICD-10-CM | POA: Diagnosis not present

## 2022-06-24 MED ORDER — IOHEXOL 350 MG/ML SOLN
100.0000 mL | Freq: Once | INTRAVENOUS | Status: AC | PRN
  Administered 2022-06-24: 100 mL via INTRAVENOUS

## 2022-06-24 MED ORDER — NITROGLYCERIN 0.4 MG SL SUBL
0.8000 mg | SUBLINGUAL_TABLET | Freq: Once | SUBLINGUAL | Status: AC
  Administered 2022-06-24: 0.8 mg via SUBLINGUAL

## 2022-06-24 MED ORDER — NITROGLYCERIN 0.4 MG SL SUBL
SUBLINGUAL_TABLET | SUBLINGUAL | Status: AC
  Filled 2022-06-24: qty 2

## 2022-06-30 ENCOUNTER — Telehealth: Payer: Self-pay

## 2022-06-30 NOTE — Telephone Encounter (Signed)
Patient notified and verbalized understanding. Patient had no questions or concerns at this time. PCP copied 

## 2022-06-30 NOTE — Telephone Encounter (Signed)
-----   Message from Christell Constant, MD sent at 06/30/2022  9:48 AM EDT ----- Minimal non obstructive CAD  No change to diagnostic or therapeutic plan.

## 2022-07-13 ENCOUNTER — Telehealth: Payer: Self-pay | Admitting: Internal Medicine

## 2022-07-13 NOTE — Telephone Encounter (Signed)
Called and spoke to patient and he states that he stopped taking the Klonopin a long time ago in the past. He states he had no side effects noted on the medication when he used to take it. Did verify pharmacy with him for Dr Maple Hudson to send in medication.

## 2022-07-13 NOTE — Telephone Encounter (Signed)
He used to have clonazepam which can help with restless legs. It is no longer on his med list. Did he stop taking it on purpose- if so, why. If not, I can re-prescribe it.

## 2022-07-13 NOTE — Telephone Encounter (Signed)
Called patient this evening and he states that he has been feeling like he is having some restless leg syndrome. Waking up in the morning feeling like he is not getting any sleep. He feels drained almost in the morning. Patient states that he has been feeling like this for a few weeks. Patient states that he had his heart looked at and they injected dye into his veins and it showed some minor blockages.   Please advise sir

## 2022-07-14 MED ORDER — CLONAZEPAM 0.5 MG PO TABS: ORAL_TABLET | ORAL | 5 refills | Status: DC

## 2022-07-14 NOTE — Telephone Encounter (Signed)
Called patient and went over medication instructions with him. Patient verbalized understanding. Nothing further needed

## 2022-07-14 NOTE — Telephone Encounter (Signed)
Clonazepam script sent. Try 1 or 2 tabs about 30 minutes before bedtime. If not enough, check back with me to discuss dose.

## 2022-09-26 ENCOUNTER — Ambulatory Visit: Payer: 59 | Attending: Internal Medicine | Admitting: Internal Medicine

## 2022-09-26 ENCOUNTER — Encounter: Payer: Self-pay | Admitting: Internal Medicine

## 2022-09-26 VITALS — BP 104/60 | HR 71 | Ht 72.0 in | Wt 207.6 lb

## 2022-09-26 DIAGNOSIS — G4733 Obstructive sleep apnea (adult) (pediatric): Secondary | ICD-10-CM

## 2022-09-26 DIAGNOSIS — Z720 Tobacco use: Secondary | ICD-10-CM

## 2022-09-26 DIAGNOSIS — I2510 Atherosclerotic heart disease of native coronary artery without angina pectoris: Secondary | ICD-10-CM | POA: Insufficient documentation

## 2022-09-26 NOTE — Patient Instructions (Signed)
Medication Instructions:  Your physician recommends that you continue on your current medications as directed. Please refer to the Current Medication list given to you today.  *If you need a refill on your cardiac medications before your next appointment, please call your pharmacy*   Lab Work: IN 1 WEEK: FLP (nothing to eat or drink 12 hours prior except water and black coffee)  If you have labs (blood work) drawn today and your tests are completely normal, you will receive your results only by: [place] (if you have MyChart) OR A paper copy in the mail If you have any lab test that is abnormal or we need to change your treatment, we will call you to review the results.   Testing/Procedures: NONE   Follow-Up: At [facility], you and your health needs are our priority.  As part of our continuing mission to provide you with exceptional heart care, we have created designated Provider Care Teams.  These Care Teams include your primary Cardiologist (physician) and Advanced Practice Providers (APPs -  Physician Assistants and Nurse Practitioners) who all work together to provide you with the care you need, when you need it.  We recommend signing up for the patient portal called "MyChart".  Sign up information is provided on this After Visit Summary.  MyChart is used to connect with patients for Virtual Visits (Telemedicine).  Patients are able to view lab/test results, encounter notes, upcoming appointments, etc.  Non-urgent messages can be sent to your provider as well.   To learn more about what you can do with MyChart, go to [email or web address].    Your next appointment:   1 year(s)  The format for your next appointment:   In Person  Provider:   Werner Lean, MD      Important Information About Sugar

## 2022-09-26 NOTE — Progress Notes (Signed)
Cardiology Office Note:    Date:  09/26/2022   ID:  [name], DOB 1971-02-26, MRN [id number]  PCP:  Dettinger, Elige Radon, MD   [facility] Providers Cardiologist:  Christell Constant, MD     Referring MD: Dettinger, Elige Radon, MD   CC: Fatigue  History of Present Illness:    Andrew [name] is a 51 y.o. male with a hx of of tobacco abuse (snuff) and FHX early CAD who presents with chest pain. In interim, had only minimally CAD.  Patient notes that he is doing Ok.   Since last visit notes no further chest pain . There are no interval hospital/ED visit.    No chest pain or pressure .  No SOB/DOE when really pushing it and no PND/Orthopnea.  No weight gain or leg swelling.  No palpitations or syncope.  Notes fatigue worse at night.  He has been lost to follow up for OSA.  He still dips.     Past Medical History:  Diagnosis Date   Acid reflux    High cholesterol    Left carpal tunnel syndrome 09/23/2020   Restless legs syndrome 08/13/2020    Past Surgical History:  Procedure Laterality Date   NO PAST SURGERIES      Current Medications: Current Meds  Medication Sig   aspirin EC 81 MG tablet Take 81 mg by mouth daily. Swallow whole.   tadalafil (CIALIS) 5 MG tablet Take 1 tablet (5 mg total) by mouth daily.   [DISCONTINUED] clonazePAM (KLONOPIN) 0.5 MG tablet 1 or 2 tabs  before bed for restless legs   [DISCONTINUED] metoprolol tartrate (LOPRESSOR) 100 MG tablet Take 100 mg 2 hours before cardiac ct     Allergies:   Patient has no known allergies.   Social History   Socioeconomic History   Marital status: Divorced    Spouse name: Not on file   Number of children: 3   Years of education: Not on file   Highest education level: Not on file  Occupational History   Occupation: Location manager  Tobacco Use   Smoking status: Never   Smokeless tobacco: Current    Types: Snuff   Tobacco comments:    1 can a day  Vaping Use   Vaping Use:  Never used  Substance and Sexual Activity   Alcohol use: Yes    Alcohol/week: 0.0 standard drinks of alcohol    Comment: occasionally-beer   Drug use: No   Sexual activity: Not on file  Other Topics Concern   Not on file  Social History Narrative   Not on file   Social Determinants of Health   Financial Resource Strain: Not on file  Food Insecurity: Not on file  Transportation Needs: Not on file  Physical Activity: Not on file  Stress: Not on file  Social Connections: Not on file     Family History: The patient's family history includes Heart disease in his father.  In his 30s  ROS:   Please see the history of present illness.    All other systems reviewed and are negative.  EKGs/Labs/Other Studies Reviewed:    The following studies were reviewed today:  EKG:   05/16/22: NSR  Cardiac Studies & Procedures     STRESS TESTS  NM MYOCAR MULTI W/SPECT W 05/30/2018  Narrative  Blood pressure demonstrated a normal response to exercise.  There was no ST segment deviation noted during stress. Excellent exercise tolerance and associated Duke treadmill score  portends a low risk of cardiac events.  Nuclear stress EF: 53%.  Defect 1: There is a medium defect of moderate severity present in the mid inferoseptal location. This is likely due to artifact. However, a mild degree of myocardial scar cannot entirely be excluded. There are no significant ischemic territories.  TID ratio is 1.25. This is of uncertain significance given his excellent exercise tolerance.      CT SCANS  CT CORONARY MORPH W/CTA COR W/SCORE 06/27/2022  Addendum 06/27/2022 11:12 AM ADDENDUM REPORT: 06/27/2022 11:09  HISTORY: Chest pain, nonspecific precordial chest pain  EXAM: Cardiac/Coronary CT  TECHNIQUE: The patient was scanned on a [employer].  PROTOCOL: A 120 kV prospective scan was triggered in the descending thoracic aorta at 111 HU's. Axial non-contrast 3 mm slices were  carried out through the heart. The data set was analyzed on a dedicated work station and scored using the Agatston method. Gantry rotation speed was 250 msecs and collimation was .6 mm. Heart rate was optimized medically and sl NTG was given. The 3D data set was reconstructed in 5% intervals of the 35-75 % of the R-R cycle. Systolic and diastolic phases were analyzed on a dedicated work station using MPR, MIP and VRT modes. The patient received 16mL OMNIPAQUE IOHEXOL 350 MG/ML SOLN of contrast.  FINDINGS: Coronary calcium score: The patient's coronary artery calcium score is 1, which places the patient in the 59th percentile.  Coronary arteries: Normal coronary origins.  Left dominance.  Right Coronary Artery: Small caliber vessel, short course. No significant plaque or stenosis.  Left Main Coronary Artery: Normal caliber vessel. No significant plaque or stenosis.  Left Anterior Descending Coronary Artery: Normal caliber vessel. No significant plaque or stenosis. Gives rise to 2 diagonal branches.  Left Circumflex Artery: Normal caliber vessel, gives rise to PDA. Trivial calcified plaque in proximal LCx with 1-24% stenosis. Gives rise to 3 OM branches.  Aorta: Normal size, 29 mm at the mid ascending aorta (level of the PA bifurcation) measured double oblique. No aortic atherosclerosis. No dissection seen in visualized portions of the aorta.  Aortic Valve: No calcifications. Trileaflet.  Other findings:  Normal pulmonary vein drainage into the left atrium.  Normal left atrial appendage without a thrombus.  Normal size of the pulmonary artery.  Normal appearance of the pericardium.  IMPRESSION: 1.  Minimal nonobstructive CAD, CADRADS = 1.  2. Coronary calcium score of 1. This was 59th percentile for age and sex matched control.  3. Normal coronary origin with left dominance.  INTERPRETATION:  CAD-RADS 1: Minimal non-obstructive CAD (0-24%).  Consider non-atherosclerotic causes of chest pain. Consider preventive therapy and risk factor modification.   Electronically Signed By: Buford Dresser M.D. On: 06/27/2022 11:09  Narrative EXAM: OVER-READ INTERPRETATION  CT CHEST  The following report is a limited chest CT over-read performed by radiologist Dr. Vinnie Langton of [facility], [place] on 06/24/2022. The coronary calcium score and cardiac CTA interpretation by the cardiologist is attached.  COMPARISON:  None Available.  FINDINGS: Within the visualized portions of the thorax there are no suspicious appearing pulmonary nodules or masses, there is no acute consolidative airspace disease, no pleural effusions, no pneumothorax and no lymphadenopathy. Visualized portions of the upper abdomen are unremarkable. There are no aggressive appearing lytic or blastic lesions noted in the visualized portions of the skeleton.  IMPRESSION: 1. No significant incidental noncardiac findings are noted.  Electronically Signed: By: Vinnie Langton M.D. On: 06/25/2022 10:07  Recent Labs: 05/16/2022: ALT 20; Hemoglobin 17.2; Platelets 222 06/09/2022: BUN 14; Creatinine, Ser 1.15; Potassium 3.4; Sodium 138  Recent Lipid Panel    Component Value Date/Time   CHOL 161 05/24/2018 1627   TRIG 185 (H) 05/24/2018 1627   HDL 34 (L) 05/24/2018 1627   CHOLHDL 4.7 05/24/2018 1627   LDLCALC 90 05/24/2018 1627       Physical Exam:    VS:  BP 104/60   Pulse 71   Ht 6' (1.829 m)   Wt 207 lb 9.6 oz (94.2 kg)   SpO2 97%   BMI 28.16 kg/m     Wt Readings from Last 3 Encounters:  09/26/22 207 lb 9.6 oz (94.2 kg)  05/27/22 214 lb (97.1 kg)  06/17/21 212 lb (96.2 kg)    GEN:  Well nourished, well developed in no acute distress HEENT: Normal NECK: No JVD LYMPHATICS: No lymphadenopathy CARDIAC: RRR, no murmurs, rubs, gallops RESPIRATORY:  Clear to auscultation without rales, wheezing or rhonchi  ABDOMEN: Soft,  non-tender, non-distended MUSCULOSKELETAL:  No edema; No deformity  SKIN: Warm and dry NEUROLOGIC:  Alert and oriented x 3 PSYCHIATRIC:  Normal affect   ASSESSMENT:    1. Coronary artery disease involving native coronary artery of native heart without angina pectoris   2. Tobacco use   3. OSA (obstructive sleep apnea)     PLAN:    Minimal non-obstructive CAD OSA Snuff Use Early history of CAD in family - discussed the naturopathic remedies he has tried and limited evidence but no risk of harm - ASA 81 mg reasonable (mainly for colon cancer prevention, stop at at 65) - lipids; may start low dose statin - reviewed CT with family - stop smoking - recommend f/u with Dr. Maple Hudson for OSA interventions.  One year with me   Medication Adjustments/Labs and Tests Ordered: Current medicines are reviewed at length with the patient today.  Concerns regarding medicines are outlined above.  Orders Placed This Encounter  Procedures   Lipid panel   No orders of the defined types were placed in this encounter.   Patient Instructions  Medication Instructions:  Your physician recommends that you continue on your current medications as directed. Please refer to the Current Medication list given to you today.  *If you need a refill on your cardiac medications before your next appointment, please call your pharmacy*   Lab Work: IN 1 WEEK: FLP (nothing to eat or drink 12 hours prior except water and black coffee)  If you have labs (blood work) drawn today and your tests are completely normal, you will receive your results only by: MyChart Message (if you have MyChart) OR A paper copy in the mail If you have any lab test that is abnormal or we need to change your treatment, we will call you to review the results.   Testing/Procedures: NONE   Follow-Up: At [facility], you and your health needs are our priority.  As part of our continuing mission to provide you with  exceptional heart care, we have created designated Provider Care Teams.  These Care Teams include your primary Cardiologist (physician) and Advanced Practice Providers (APPs -  Physician Assistants and Nurse Practitioners) who all work together to provide you with the care you need, when you need it.  We recommend signing up for the patient portal called "MyChart".  Sign up information is provided on this After Visit Summary.  MyChart is used to connect with patients for Virtual Visits (Telemedicine).  Patients are able to view lab/test results, encounter notes, upcoming appointments, etc.  Non-urgent messages can be sent to your provider as well.   To learn more about what you can do with MyChart, go to [email or web address].    Your next appointment:   1 year(s)  The format for your next appointment:   In Person  Provider:   Christell Constant, MD      Important Information About Sugar         Signed, Christell Constant, MD  09/26/2022 5:57 PM    [facility]

## 2023-01-17 ENCOUNTER — Ambulatory Visit: Payer: 59 | Admitting: Family Medicine

## 2023-01-17 ENCOUNTER — Encounter: Payer: Self-pay | Admitting: Family Medicine

## 2023-01-17 VITALS — BP 116/73 | HR 78 | Temp 98.1°F | Ht 73.0 in | Wt 212.8 lb

## 2023-01-17 DIAGNOSIS — M79651 Pain in right thigh: Secondary | ICD-10-CM

## 2023-01-17 DIAGNOSIS — M79604 Pain in right leg: Secondary | ICD-10-CM

## 2023-01-17 DIAGNOSIS — R58 Hemorrhage, not elsewhere classified: Secondary | ICD-10-CM | POA: Diagnosis not present

## 2023-01-17 NOTE — Progress Notes (Signed)
Subjective:  Patient ID: Andrew [name], male    DOB: [birth year], 52 y.o.   MRN: [id number]  Patient Care Team: Dettinger, Fransisca Kaufmann, MD as PCP - General (Family Medicine) Werner Lean, MD as PCP - Cardiology (Cardiology)   Chief Complaint:  hamstring pain (Right leg x1-2 after exercising. States he felt it pop. Yesterday at work patient states he made the pain worse due to lifting at work. )   HPI: Andrew [name] is a 52 y.o. male presenting on 01/17/2023 for hamstring pain (Right leg x1-2 after exercising. States he felt it pop. Yesterday at work patient states he made the pain worse due to lifting at work. )   Pt presents today with complaints of RLE pain and bruising. States he was working out about 1.5 weeks ago and felt his hamstring pop 2-3 times states it was hurting for several days and then started to feel better. States he lifted his leg to scrape the mud off of his shoe a few days ago and the pain started again and has remained. He does report significant bruising to the back of his leg.     Relevant past medical, surgical, family, and social history reviewed and updated as indicated.  Allergies and medications reviewed and updated. Data reviewed: Chart in Epic.   Past Medical History:  Diagnosis Date   Acid reflux    High cholesterol    Left carpal tunnel syndrome 09/23/2020   Restless legs syndrome 08/13/2020    Past Surgical History:  Procedure Laterality Date   NO PAST SURGERIES      Social History   Socioeconomic History   Marital status: Divorced    Spouse name: Not on file   Number of children: 3   Years of education: Not on file   Highest education level: Not on file  Occupational History   Occupation: Glass blower/designer  Tobacco Use   Smoking status: Never   Smokeless tobacco: Current    Types: Snuff   Tobacco comments:    1 can a day  Vaping Use   Vaping Use: Never used  Substance and Sexual Activity   Alcohol use: Yes     Alcohol/week: 0.0 standard drinks of alcohol    Comment: occasionally-beer   Drug use: No   Sexual activity: Not on file  Other Topics Concern   Not on file  Social History Narrative   Not on file   Social Determinants of Health   Financial Resource Strain: Not on file  Food Insecurity: Not on file  Transportation Needs: Not on file  Physical Activity: Not on file  Stress: Not on file  Social Connections: Not on file  Intimate Partner Violence: Not on file    Outpatient Encounter Medications as of 01/17/2023  Medication Sig   aspirin EC 81 MG tablet Take 81 mg by mouth daily. Swallow whole.   tadalafil (CIALIS) 5 MG tablet Take 1 tablet (5 mg total) by mouth daily.   No facility-administered encounter medications on file as of 01/17/2023.    No Known Allergies  Review of Systems  Constitutional:  Negative for activity change, appetite change, chills, fatigue and fever.  HENT: Negative.    Eyes: Negative.   Respiratory:  Negative for cough, chest tightness and shortness of breath.   Cardiovascular:  Negative for chest pain, palpitations and leg swelling.  Gastrointestinal:  Negative for blood in stool, constipation, diarrhea, nausea and vomiting.  Endocrine: Negative.   Genitourinary:  Negative for dysuria, frequency and urgency.  Musculoskeletal:  Positive for myalgias. Negative for arthralgias.  Skin:  Positive for color change.  Allergic/Immunologic: Negative.   Neurological:  Negative for dizziness and headaches.  Hematological: Negative.   Psychiatric/Behavioral:  Negative for confusion, hallucinations, sleep disturbance and suicidal ideas.   All other systems reviewed and are negative.       Objective:  BP 116/73   Pulse 78   Temp 98.1 F (36.7 C) (Temporal)   Ht 6' 1"$  (1.854 m)   Wt 212 lb 12.8 oz (96.5 kg)   SpO2 96%   BMI 28.08 kg/m    Wt Readings from Last 3 Encounters:  01/17/23 212 lb 12.8 oz (96.5 kg)  09/26/22 207 lb 9.6 oz (94.2 kg)   05/27/22 214 lb (97.1 kg)    Physical Exam Vitals and nursing note reviewed.  Constitutional:      Appearance: Normal appearance.  HENT:     Head: Normocephalic and atraumatic.     Nose: Nose normal.     Mouth/Throat:     Mouth: Mucous membranes are moist.  Eyes:     Pupils: Pupils are equal, round, and reactive to light.  Cardiovascular:     Rate and Rhythm: Normal rate and regular rhythm.     Heart sounds: Normal heart sounds.  Pulmonary:     Effort: Pulmonary effort is normal.     Breath sounds: Normal breath sounds.  Musculoskeletal:     Right hip: Normal.     Right upper leg: Swelling, edema and tenderness present. No deformity, lacerations or bony tenderness.     Right knee: Normal.     Comments: Right posterior thigh: significant ecchymosis from hip to popliteal space. Positive FABER test.  Skin:    General: Skin is warm and dry.     Capillary Refill: Capillary refill takes less than 2 seconds.  Neurological:     General: No focal deficit present.     Mental Status: He is alert and oriented to person, place, and time.  Psychiatric:        Mood and Affect: Mood normal.        Behavior: Behavior normal.        Thought Content: Thought content normal.        Judgment: Judgment normal.     Results for orders placed or performed during the hospital encounter of [id number]  Basic metabolic panel  Result Value Ref Range   Sodium 138 135 - 145 mmol/L   Potassium 3.4 (L) 3.5 - 5.1 mmol/L   Chloride 104 98 - 111 mmol/L   CO2 28 22 - 32 mmol/L   Glucose, Bld 113 (H) 70 - 99 mg/dL   BUN 14 6 - 20 mg/dL   Creatinine, Ser 1.15 0.61 - 1.24 mg/dL   Calcium 9.0 8.9 - 10.3 mg/dL   GFR, Estimated >60 >60 mL/min   Anion gap 6 5 - 15       Pertinent labs & imaging results that were available during my care of the patient were reviewed by me and considered in my medical decision making.  Assessment & Plan:  Jayda was seen today for hamstring pain.  Diagnoses and all  orders for this visit:  Acute leg pain, right Pain of right thigh Ecchymosis Concerns for hamstring injury. STAT US ordered. Additional treatment once resulted.  -     [place] RT LOWER EXTREM LTD SOFT TISSUE NON VASCULAR; Future     Continue all other  maintenance medications.  Follow up plan: Return if symptoms worsen or fail to improve.   Continue healthy lifestyle choices, including diet (rich in fruits, vegetables, and lean proteins, and low in salt and simple carbohydrates) and exercise (at least 30 minutes of moderate physical activity daily).    The above assessment and management plan was discussed with the patient. The patient verbalized understanding of and has agreed to the management plan. Patient is aware to call the clinic if they develop any new symptoms or if symptoms persist or worsen. Patient is aware when to return to the clinic for a follow-up visit. Patient educated on when it is appropriate to go to the emergency department.   Monia Pouch, FNP-C [facility] [phone]

## 2023-01-19 DIAGNOSIS — S76311A Strain of muscle, fascia and tendon of the posterior muscle group at thigh level, right thigh, initial encounter: Secondary | ICD-10-CM | POA: Diagnosis not present

## 2023-01-20 ENCOUNTER — Other Ambulatory Visit: Payer: Self-pay | Admitting: Family Medicine

## 2023-01-20 DIAGNOSIS — M79604 Pain in right leg: Secondary | ICD-10-CM

## 2023-01-20 DIAGNOSIS — M79651 Pain in right thigh: Secondary | ICD-10-CM

## 2023-01-20 DIAGNOSIS — S76301A Unspecified injury of muscle, fascia and tendon of the posterior muscle group at thigh level, right thigh, initial encounter: Secondary | ICD-10-CM

## 2023-01-20 DIAGNOSIS — R58 Hemorrhage, not elsewhere classified: Secondary | ICD-10-CM

## 2023-01-23 ENCOUNTER — Ambulatory Visit: Admission: RE | Admit: 2023-01-23 | Payer: 59 | Source: Ambulatory Visit

## 2023-03-22 ENCOUNTER — Ambulatory Visit: Payer: 59 | Admitting: Urology

## 2023-03-22 VITALS — BP 113/74 | HR 62

## 2023-03-22 DIAGNOSIS — N401 Enlarged prostate with lower urinary tract symptoms: Secondary | ICD-10-CM

## 2023-03-22 DIAGNOSIS — N5201 Erectile dysfunction due to arterial insufficiency: Secondary | ICD-10-CM | POA: Diagnosis not present

## 2023-03-22 DIAGNOSIS — R351 Nocturia: Secondary | ICD-10-CM | POA: Diagnosis not present

## 2023-03-22 DIAGNOSIS — N138 Other obstructive and reflux uropathy: Secondary | ICD-10-CM

## 2023-03-22 MED ORDER — TADALAFIL 5 MG PO TABS: 5.0000 mg | ORAL_TABLET | Freq: Every day | ORAL | 3 refills | Status: DC

## 2023-03-22 NOTE — Progress Notes (Signed)
03/22/2023 4:09 PM   Andrew [name] 26-Aug-1971 [id number]  Referring provider: Dettinger, Andrew Radon, MD [address] [place],  [place] [address]  Followup BPH and Erectile dysfunction   HPI: Andrew [name] is a 52yo here for followup for erectile dysfunction and BPH. He uses tadalafil 5mg  daily with good results. He denies nay issues with erections. IPSS 4 QOL 1. Urine stream strong. No straining to urinate. Nocturia 1x. No urinary hesitancy   PMH: Past Medical History:  Diagnosis Date   Acid reflux    High cholesterol    Left carpal tunnel syndrome 09/23/2020   Restless legs syndrome 08/13/2020    Surgical History: Past Surgical History:  Procedure Laterality Date   NO PAST SURGERIES      Home Medications:  Allergies as of 03/22/2023   No Known Allergies      Medication List        Accurate as of March 22, 2023  4:09 PM. If you have any questions, ask your nurse or doctor.          aspirin EC 81 MG tablet Take 81 mg by mouth daily. Swallow whole.   tadalafil 5 MG tablet Commonly known as: CIALIS Take 1 tablet (5 mg total) by mouth daily.        Allergies: No Known Allergies  Family History: Family History  Problem Relation Age of Onset   Heart disease Father     Social History:  reports that he has never smoked. His smokeless tobacco use includes snuff. He reports current alcohol use. He reports that he does not use drugs.  ROS: All other review of systems were reviewed and are negative except what is noted above in HPI  Physical Exam: BP 113/74   Pulse 62   Constitutional:  Alert and oriented, No acute distress. HEENT: [place] AT, moist mucus membranes.  Trachea midline, no masses. Cardiovascular: No clubbing, cyanosis, or edema. Respiratory: Normal respiratory effort, no increased work of breathing. GI: Abdomen is soft, nontender, nondistended, no abdominal masses GU: No CVA tenderness.  Lymph: No cervical or inguinal lymphadenopathy. Skin:  No rashes, bruises or suspicious lesions. Neurologic: Grossly intact, no focal deficits, moving all 4 extremities. Psychiatric: Normal mood and affect.  Laboratory Data: Lab Results  Component Value Date   WBC 9.7 05/16/2022   HGB 17.2 05/16/2022   HCT 48.3 05/16/2022   MCV 93 05/16/2022   PLT 222 05/16/2022    Lab Results  Component Value Date   CREATININE 1.15 06/09/2022    No results found for: "PSA"  Lab Results  Component Value Date   TESTOSTERONE 351 01/22/2020    No results found for: "HGBA1C"  Urinalysis No results found for: "COLORURINE", "APPEARANCEUR", "LABSPEC", "PHURINE", "GLUCOSEU", "HGBUR", "BILIRUBINUR", "KETONESUR", "PROTEINUR", "UROBILINOGEN", "NITRITE", "LEUKOCYTESUR"  No results found for: "LABMICR", "WBCUA", "RBCUA", "LABEPIT", "MUCUS", "BACTERIA"  Pertinent Imaging:  No results found for this or any previous visit.  No results found for this or any previous visit.  No results found for this or any previous visit.  No results found for this or any previous visit.  No results found for this or any previous visit.  No valid procedures specified. No results found for this or any previous visit.  No results found for this or any previous visit.   Assessment & Plan:    1. Erectile dysfunction due to arterial insufficiency -tadalafil 5mg  daily  2. Benign prostatic hyperplasia with urinary obstruction -tadalafil 5mg  daily  3. Nocturia Tadalafil 5mg   daily   No follow-ups on file.  Andrew Aye, MD  [facility] [place]

## 2023-03-23 LAB — MICROSCOPIC EXAMINATION: Bacteria, UA: NONE SEEN

## 2023-03-23 LAB — URINALYSIS, ROUTINE W REFLEX MICROSCOPIC
Bilirubin, UA: NEGATIVE
Glucose, UA: NEGATIVE
Ketones, UA: NEGATIVE
Leukocytes,UA: NEGATIVE
Nitrite, UA: NEGATIVE
Protein,UA: NEGATIVE
Specific Gravity, UA: 1.02 (ref 1.005–1.030)
Urobilinogen, Ur: 1 mg/dL (ref 0.2–1.0)
pH, UA: 7.5 (ref 5.0–7.5)

## 2023-03-28 ENCOUNTER — Encounter: Payer: Self-pay | Admitting: Urology

## 2023-03-28 NOTE — Patient Instructions (Signed)

## 2023-12-22 ENCOUNTER — Ambulatory Visit (INDEPENDENT_AMBULATORY_CARE_PROVIDER_SITE_OTHER): Payer: 59 | Admitting: Family Medicine

## 2023-12-22 ENCOUNTER — Encounter: Payer: Self-pay | Admitting: Family Medicine

## 2023-12-22 VITALS — BP 106/65 | HR 116 | Temp 97.5°F | Ht 73.0 in | Wt 223.0 lb

## 2023-12-22 DIAGNOSIS — R509 Fever, unspecified: Secondary | ICD-10-CM

## 2023-12-22 DIAGNOSIS — J101 Influenza due to other identified influenza virus with other respiratory manifestations: Secondary | ICD-10-CM

## 2023-12-22 DIAGNOSIS — R52 Pain, unspecified: Secondary | ICD-10-CM | POA: Diagnosis not present

## 2023-12-22 LAB — VERITOR FLU A/B WAIVED
Influenza A: POSITIVE — AB
Influenza B: NEGATIVE

## 2023-12-22 LAB — RSV AG, IMMUNOCHR, WAIVED: RSV Ag, Immunochr, Waived: NEGATIVE

## 2023-12-22 MED ORDER — OSELTAMIVIR PHOSPHATE 75 MG PO CAPS: 75.0000 mg | ORAL_CAPSULE | Freq: Two times a day (BID) | ORAL | 0 refills | Status: AC

## 2023-12-22 MED ORDER — FLUTICASONE PROPIONATE 50 MCG/ACT NA SUSP: 1.0000 | Freq: Two times a day (BID) | NASAL | 6 refills | Status: AC | PRN

## 2023-12-22 NOTE — Progress Notes (Signed)
BP 106/65   Pulse (!) 116   Temp (!) 97.5 F (36.4 C)   Ht 6\' 1"  (1.854 m)   Wt 101.2 kg   SpO2 97%   BMI 29.42 kg/m    Subjective:   Patient ID: Andrew [name], male    DOB: 1971/10/29, 53 y.o.   MRN: [id number]  HPI: Lennie Vasco is a 53 y.o. male presenting on 12/22/2023 for Fever and Generalized Body Aches   Fever  Associated symptoms include congestion, coughing and headaches. Pertinent negatives include no chest pain, diarrhea, nausea, rash or sore throat.   Last night started feeling a tickle in his throat. Woke up this morning with a fever and body aches. Took a shower and  went to work where his temp was 103. He took Tylenol which broke his fever but no improvement in body aches. Reports muscle pain in back. He has congestion with productive cough with clear phlegm and has noted his eyes are more watery. He was around a friend with a stomach bug 3 days ago, but he denies any diarrhea. Denies chest pain, shortness of breath, or chills.  Relevant past medical, surgical, family and social history reviewed and updated as indicated. Interim medical history since our last visit reviewed. Allergies and medications reviewed and updated.  Review of Systems  Constitutional:  Positive for fever. Negative for chills and fatigue.  HENT:  Positive for congestion. Negative for sinus pressure and sore throat.   Eyes:  Positive for discharge.  Respiratory:  Positive for cough. Negative for chest tightness and shortness of breath.   Cardiovascular:  Negative for chest pain and palpitations.  Gastrointestinal:  Negative for diarrhea and nausea.  Musculoskeletal:  Positive for myalgias.  Skin:  Negative for rash.  Neurological:  Positive for headaches. Negative for light-headedness.  Psychiatric/Behavioral:  Negative for agitation.   All other systems reviewed and are negative.   Per HPI unless specifically indicated above   Allergies as of 12/22/2023   No Known  Allergies      Medication List        Accurate as of December 22, 2023  9:31 AM. If you have any questions, ask your nurse or doctor.          STOP taking these medications    aspirin EC 81 MG tablet Stopped by: Elige Radon Joleigh Mineau       TAKE these medications    fluticasone 50 MCG/ACT nasal spray Commonly known as: FLONASE Place 1 spray into both nostrils 2 (two) times daily as needed for allergies or rhinitis. Started by: Elige Radon Selina Tapper   oseltamivir 75 MG capsule Commonly known as: Tamiflu Take 1 capsule (75 mg total) by mouth 2 (two) times daily for 5 days. Started by: Elige Radon Chrstopher Malenfant   tadalafil 5 MG tablet Commonly known as: CIALIS Take 1 tablet (5 mg total) by mouth daily.         Objective:   BP 106/65   Pulse (!) 116   Temp (!) 97.5 F (36.4 C)   Ht 6\' 1"  (1.854 m)   Wt 101.2 kg   SpO2 97%   BMI 29.42 kg/m   Wt Readings from Last 3 Encounters:  12/22/23 101.2 kg  01/17/23 96.5 kg  09/26/22 94.2 kg    Physical Exam Vitals reviewed.  Constitutional:      General: He is not in acute distress.    Appearance: Normal appearance. He is normal weight. He is not diaphoretic.  HENT:     Right Ear: Tympanic membrane normal.     Left Ear: Tympanic membrane normal.     Nose: Congestion present.     Right Sinus: No maxillary sinus tenderness or frontal sinus tenderness.     Left Sinus: No maxillary sinus tenderness or frontal sinus tenderness.     Mouth/Throat:     Mouth: Mucous membranes are moist.     Pharynx: No oropharyngeal exudate or posterior oropharyngeal erythema.  Eyes:     Conjunctiva/sclera: Conjunctivae normal.     Comments: Epiphora present  Cardiovascular:     Rate and Rhythm: Normal rate and regular rhythm.     Heart sounds: Normal heart sounds. No murmur heard. Pulmonary:     Effort: Pulmonary effort is normal. No respiratory distress.     Breath sounds: Normal breath sounds. No wheezing or rhonchi.     Comments:  Strong cough Skin:    General: Skin is warm and dry.  Neurological:     Mental Status: He is alert and oriented to person, place, and time.  Psychiatric:        Behavior: Behavior normal.       Assessment & Plan:   Problem List Items Addressed This Visit   None Visit Diagnoses       Influenza due to influenza A virus    -  Primary   Relevant Medications   oseltamivir (TAMIFLU) 75 MG capsule   fluticasone (FLONASE) 50 MCG/ACT nasal spray   Other Relevant Orders   Veritor Flu A/B Waived   RSV Ag, Immunochr, Waived   Novel Coronavirus, NAA ([facility])     Body aches       Relevant Medications   oseltamivir (TAMIFLU) 75 MG capsule   fluticasone (FLONASE) 50 MCG/ACT nasal spray   Other Relevant Orders   Veritor Flu A/B Waived   RSV Ag, Immunochr, Waived   Novel Coronavirus, NAA ([facility])     Fever, unspecified fever cause       Relevant Medications   oseltamivir (TAMIFLU) 75 MG capsule   fluticasone (FLONASE) 50 MCG/ACT nasal spray   Other Relevant Orders   Veritor Flu A/B Waived   RSV Ag, Immunochr, Waived   Novel Coronavirus, NAA ([facility])       Patient is positive for Influenza A with body aches and fever. He was rapid tested for COVID, RSV, and Influenza virus at our office today.  Will give Tamilfu for 5 days, he is negative for any increased risk for use. Will give Flonase to aid in nasal congestion. Encouraged use of Tylenol and Ibuprofen for fever and body ache relief along with continued hydration. Avoid close contact with others until fever free for 24 hours.  Follow up plan: Return if symptoms worsen or fail to improve.  Counseling provided for all of the vaccine components Orders Placed This Encounter  Procedures   RSV Ag, Immunochr, Waived   Novel Coronavirus, NAA ([facility])   Veritor Flu A/B Waived    Maximino Sarin PA-S [facility] 12/22/2023, 9:31 AM  Patient seen and examined with PA student, agree with assessment and  plan above.  Patient was positive for influenza A and will treat with Tamiflu and recommended Flonase and that he can use Benadryl and Tylenol and ibuprofen to help with the fevers.  Arville Care, MD [facility] 12/22/2023, 1:38 PM

## 2023-12-25 ENCOUNTER — Encounter: Payer: Self-pay | Admitting: Family Medicine

## 2023-12-25 LAB — NOVEL CORONAVIRUS, NAA: SARS-CoV-2, NAA: NOT DETECTED

## 2024-01-28 NOTE — Progress Notes (Unsigned)
 HPI- male former smoker followed for OSA, complicated by GERD NPSG 03/27/16-AHI 40.1/hour, desaturation to 85%, CPAP titration to 9, body weight 200 pounds  --------------------------------------------------------------------   06/17/21- 53 year old male former smoker followed for OSA, complicated by GERD, Restless Legs, CPAP 4-15  auto/Adapt  Uses different machine at his girlfriend's house, set the same -Clonazepam,  Mirapex,  Download- compliance 67% on this machine (1 of 2), AHI 1.3/ hr Body weight today-212 lbs Covid vax, set the same We had ordered overnight oximetry> not qualified for sleep O2 Not snoring through CPAP per girlfirend, but she tells him he is tired in day and cross. Admits he works hard. Even when he sleeps well, he feels unrested. Still using 2 machines, set the same. Afrin worked well for nasal congestion, understands can't use regularly. Concerned Cialis not adequate. Asks about Urology referral to different doctor. Raised question of "low T".  01/30/24- 53 year old male former smoker followed for OSA, complicated by GERD, Restless Legs, CPAP 4-15  auto/Adapt  Uses different machine at his girlfriend's house, set the same -Clonazepam,  Mirapex,  Download- compliance-                                            has 2 machines Body weight today-   ROS-see HPI   + = positive Constitutional:    weight loss, night sweats, fevers, chills,  + fatigue, lassitude. HEENT:    headaches, difficulty swallowing, tooth/dental problems, sore throat,       sneezing, itching, ear ache, + nasal congestion, post nasal drip, snoring CV:    chest pain, orthopnea, PND, swelling in lower extremities, anasarca,                                                   dizziness, palpitations Resp:   shortness of breath with exertion or at rest.                productive cough,   non-productive cough, coughing up of blood.              change in color of mucus.  wheezing.   Skin:    rash or  lesions. GI:  No-   heartburn, indigestion, abdominal pain, nausea, vomiting, diarrhea,                 change in bowel habits, loss of appetite GU: No-dysuria, change in color of urine, no urgency or frequency.   flank pain. MS:   joint pain, stiffness, decreased range of motion, back pain. Neuro-     nothing unusual Psych:  change in mood or affect.  depression or anxiety.   memory loss.  OBJ- Physical Exam   stable baseline exam-fit-looking General- Alert, Oriented, Affect-appropriate, Distress- none acute, not overweight Skin- rash-none, lesions- none, excoriation- none Lymphadenopathy- none Head- atraumatic            Eyes- Gross vision intact, PERRLA, conjunctivae and secretions clear            Ears- Hearing, canals-normal            Nose- Clear, no-Septal dev, mucus, polyps, erosion, perforation             Throat- Mallampati III-IV ,  mucosa clear , drainage- none, tonsils- atrophic Neck- flexible , trachea midline, no stridor , thyroid nl, carotid no bruit Chest - symmetrical excursion , unlabored           Heart/CV- RRR , no murmur , no gallop  , no rub, nl s1 s2                           - JVD- none , edema- none, stasis changes- none, varices- none           Lung- clear to P&A, wheeze- none, cough- none , dullness-none, rub- none           Chest wall-  Abd-  Br/ Gen/ Rectal- Not done, not indicated Extrem- cyanosis- none, clubbing, none, atrophy- none, strength- nl Neuro- grossly intact to observation

## 2024-01-30 ENCOUNTER — Ambulatory Visit: Payer: 59 | Admitting: Internal Medicine

## 2024-01-30 ENCOUNTER — Encounter: Payer: Self-pay | Admitting: Internal Medicine

## 2024-01-30 VITALS — BP 110/78 | HR 99 | Temp 98.1°F | Ht 72.0 in | Wt 218.0 lb

## 2024-01-30 DIAGNOSIS — G4733 Obstructive sleep apnea (adult) (pediatric): Secondary | ICD-10-CM

## 2024-01-30 DIAGNOSIS — Z87891 Personal history of nicotine dependence: Secondary | ICD-10-CM | POA: Diagnosis not present

## 2024-01-30 DIAGNOSIS — G4700 Insomnia, unspecified: Secondary | ICD-10-CM | POA: Diagnosis not present

## 2024-01-30 MED ORDER — ZOLPIDEM TARTRATE 10 MG PO TABS: ORAL_TABLET | ORAL | 5 refills | Status: DC

## 2024-01-30 NOTE — Patient Instructions (Addendum)
 Order- DME [employer]- please replace old CPAP machine, auto 4-15. Refit mask of choice for comfort and teach how to adjust humidifier, continue humidifier, supplies. AirView.  Script sent for ambien 10 mg- try breaking it in half to start

## 2024-03-18 ENCOUNTER — Ambulatory Visit: Payer: 59 | Admitting: Urology

## 2024-03-25 ENCOUNTER — Ambulatory Visit (INDEPENDENT_AMBULATORY_CARE_PROVIDER_SITE_OTHER): Payer: 59 | Admitting: Urology

## 2024-03-25 VITALS — BP 111/68 | HR 74

## 2024-03-25 DIAGNOSIS — R351 Nocturia: Secondary | ICD-10-CM

## 2024-03-25 DIAGNOSIS — N138 Other obstructive and reflux uropathy: Secondary | ICD-10-CM | POA: Diagnosis not present

## 2024-03-25 DIAGNOSIS — N5201 Erectile dysfunction due to arterial insufficiency: Secondary | ICD-10-CM | POA: Diagnosis not present

## 2024-03-25 DIAGNOSIS — N401 Enlarged prostate with lower urinary tract symptoms: Secondary | ICD-10-CM

## 2024-03-25 MED ORDER — SILDENAFIL CITRATE 100 MG PO TABS: 100.0000 mg | ORAL_TABLET | ORAL | 5 refills | Status: AC | PRN

## 2024-03-25 MED ORDER — TADALAFIL 5 MG PO TABS: 5.0000 mg | ORAL_TABLET | Freq: Every day | ORAL | 3 refills | Status: AC

## 2024-03-25 NOTE — Progress Notes (Signed)
 03/25/2024 4:25 PM   Andrew [name] June 23, 1971 [id number]  Referring provider: Dettinger, Lucio Sabin, MD [address] [place],  [place] [address]  Followup BPH   HPI: Andrew [name] is a 53yo here for followup for BPH with nocturia and erectile dysfunction. He notes tadalafil  5mg  helps with his urination but does not improve his erections. He has not had his testosterone  check in several years. He does have decreased libido. He continues to have issues maintaining his erection    PMH: Past Medical History:  Diagnosis Date   Acid reflux    High cholesterol    Left carpal tunnel syndrome 09/23/2020   Restless legs syndrome 08/13/2020    Surgical History: Past Surgical History:  Procedure Laterality Date   NO PAST SURGERIES      Home Medications:  Allergies as of 03/25/2024   No Known Allergies      Medication List        Accurate as of March 25, 2024  4:25 PM. If you have any questions, ask your nurse or doctor.          fluticasone  50 MCG/ACT nasal spray Commonly known as: FLONASE  Place 1 spray into both nostrils 2 (two) times daily as needed for allergies or rhinitis.   tadalafil  5 MG tablet Commonly known as: CIALIS  Take 1 tablet (5 mg total) by mouth daily.   zolpidem  10 MG tablet Commonly known as: AMBIEN  1 tab at bedtime as needed for sleep        Allergies: No Known Allergies  Family History: Family History  Problem Relation Age of Onset   Heart disease Father     Social History:  reports that he has never smoked. His smokeless tobacco use includes snuff. He reports current alcohol use. He reports that he does not use drugs.  ROS: All other review of systems were reviewed and are negative except what is noted above in HPI  Physical Exam: BP 111/68   Pulse 74   Constitutional:  Alert and oriented, No acute distress. HEENT: [place] AT, moist mucus membranes.  Trachea midline, no masses. Cardiovascular: No clubbing, cyanosis, or  edema. Respiratory: Normal respiratory effort, no increased work of breathing. GI: Abdomen is soft, nontender, nondistended, no abdominal masses GU: No CVA tenderness.  Lymph: No cervical or inguinal lymphadenopathy. Skin: No rashes, bruises or suspicious lesions. Neurologic: Grossly intact, no focal deficits, moving all 4 extremities. Psychiatric: Normal mood and affect.  Laboratory Data: Lab Results  Component Value Date   WBC 9.7 05/16/2022   HGB 17.2 05/16/2022   HCT 48.3 05/16/2022   MCV 93 05/16/2022   PLT 222 05/16/2022    Lab Results  Component Value Date   CREATININE 1.15 06/09/2022    No results found for: "PSA"  Lab Results  Component Value Date   TESTOSTERONE  351 01/22/2020    No results found for: "HGBA1C"  Urinalysis    Component Value Date/Time   APPEARANCEUR Clear 03/22/2023 1613   GLUCOSEU Negative 03/22/2023 1613   BILIRUBINUR Negative 03/22/2023 1613   PROTEINUR Negative 03/22/2023 1613   NITRITE Negative 03/22/2023 1613   LEUKOCYTESUR Negative 03/22/2023 1613    Lab Results  Component Value Date   LABMICR See below: 03/22/2023   WBCUA 0-5 03/22/2023   LABEPIT 0-10 03/22/2023   BACTERIA None seen 03/22/2023    Pertinent Imaging:  No results found for this or any previous visit.  No results found for this or any previous visit.  No results  found for this or any previous visit.  No results found for this or any previous visit.  No results found for this or any previous visit.  No results found for this or any previous visit.  No results found for this or any previous visit.  No results found for this or any previous visit.   Assessment & Plan:    1. Benign prostatic hyperplasia with urinary obstruction (Primary) Continue tadalafil  5mg   - Urinalysis, Routine w reflex microscopic  2. Erectile dysfunction due to arterial insufficiency Testosterone  lab Sildenafil  50mg  prn  3. Nocturia Tadalafil  5mg  daily   No follow-ups  on file.  Johnie Nailer, MD  [facility] [place]

## 2024-03-26 ENCOUNTER — Other Ambulatory Visit

## 2024-03-26 LAB — URINALYSIS, ROUTINE W REFLEX MICROSCOPIC
Bilirubin, UA: NEGATIVE
Glucose, UA: NEGATIVE
Ketones, UA: NEGATIVE
Leukocytes,UA: NEGATIVE
Nitrite, UA: NEGATIVE
Protein,UA: NEGATIVE
Specific Gravity, UA: 1.005 (ref 1.005–1.030)
Urobilinogen, Ur: 0.2 mg/dL (ref 0.2–1.0)
pH, UA: 7 (ref 5.0–7.5)

## 2024-03-26 LAB — MICROSCOPIC EXAMINATION: Bacteria, UA: NONE SEEN

## 2024-03-27 ENCOUNTER — Other Ambulatory Visit

## 2024-03-28 LAB — TESTOSTERONE,FREE AND TOTAL
Testosterone, Free: 9 pg/mL (ref 7.2–24.0)
Testosterone: 401 ng/dL (ref 264–916)

## 2024-04-01 ENCOUNTER — Encounter: Payer: Self-pay | Admitting: Urology

## 2024-04-01 NOTE — Patient Instructions (Signed)

## 2024-05-01 NOTE — Progress Notes (Deleted)
 HPI- male former smoker followed for OSA, complicated by GERD NPSG 03/27/16-AHI 40.1/hour, desaturation to 85%, CPAP titration to 9, body weight 200 pounds  --------------------------------------------------------------------   01/30/24- 53 year old male former smoker followed for OSA, complicated by GERD, Restless Legs, CPAP 4-15  auto/Adapt  Uses different machine at his girlfriend's house, set the same   Mirapex ,  Download- compliance-    93%, AHI 2.3/hr                                         Body weight today-218 lbs Asks about alternatives to CPAP, saying he has never been comfortable. Old machine now is noisy. Discussed the use of AI scribe software for clinical note transcription with the patient, who gave verbal consent to proceed. History of Present Illness   The patient, with a history of severe sleep apnea, has been using a CPAP machine for several years. Recently, he has been experiencing dry mouth, which we attribute to the indoor heat. He also reports that the machine is old and making noise. Despite the machine indicating eight hours of sleep, the patient does not feel rested. He has previously tried Ambien  for sleep and found it effective initially, but later experienced sleepwalking. He did much better at lower dose. Other meds tried, including temazepam, clonazepam , did not work as well. He has not used any sleep medicine for several months.   05/02/24- 53 year old male former smoker followed for OSA, complicated by GERD, Restless Legs, CPAP 4-15  auto/Adapt  Uses different machine at his girlfriend's house, set the same   Mirapex ,  CPAP auto 4-15/ [employer]- replaced 01/30/24 Download- compliance-                                          Body weight today-     ROS-see HPI   + = positive Constitutional:    weight loss, night sweats, fevers, chills,  + fatigue, lassitude. HEENT:    headaches, difficulty swallowing, tooth/dental problems, sore throat,       sneezing, itching, ear  ache, + nasal congestion, post nasal drip, snoring CV:    chest pain, orthopnea, PND, swelling in lower extremities, anasarca,                                                   dizziness, palpitations Resp:   shortness of breath with exertion or at rest.                productive cough,   non-productive cough, coughing up of blood.              change in color of mucus.  wheezing.   Skin:    rash or lesions. GI:  No-   heartburn, indigestion, abdominal pain, nausea, vomiting, diarrhea,                 change in bowel habits, loss of appetite GU: No-dysuria, change in color of urine, no urgency or frequency.   flank pain. MS:   joint pain, stiffness, decreased range of motion, back pain. Neuro-     nothing unusual Psych:  change in mood or  affect.  depression or anxiety.   memory loss.  OBJ- Physical Exam   stable baseline exam-fit-looking General- Alert, Oriented, Affect-appropriate, Distress- none acute, not overweight Skin- rash-none, lesions- none, excoriation- none Lymphadenopathy- none Head- atraumatic            Eyes- Gross vision intact, PERRLA, conjunctivae and secretions clear            Ears- Hearing, canals-normal            Nose- Clear, no-Septal dev, mucus, polyps, erosion, perforation             Throat- Mallampati III-IV , mucosa clear , drainage- none, tonsils- atrophic Neck- flexible , trachea midline, no stridor , thyroid  nl, carotid no bruit Chest - symmetrical excursion , unlabored           Heart/CV- RRR , no murmur , no gallop  , no rub, nl s1 s2                           - JVD- none , edema- none, stasis changes- none, varices- none           Lung- clear to P&A, wheeze- none, cough- none , dullness-none, rub- none           Chest wall-  Abd-  Br/ Gen/ Rectal- Not done, not indicated Extrem- cyanosis- none, clubbing, none, atrophy- none, strength- nl Neuro- grossly intact to observation Assessment and Plan:   Obstructive Sleep Apnea (OSA) Severe OSA with  worsening symptoms despite CPAP use. Current CPAP machine likely malfunctioning. Discussed alternative treatments: oral appliances and Inspire therapy. Oral appliances less suitable due to severity. Inspire therapy viable but requires surgical implantation and adjustment period. - Order new CPAP machine with refitting of mask and hose. - Adjust humidifier settings on CPAP machine. - Discuss potential referral to ENT for Inspire therapy if CPAP remains ineffective. - Discuss potential referral for oral appliance therapy if interested.  Insomnia Insomnia with previous Ambien  use causing sleepwalking. Lower dose did well.. Other medications ineffective. Interested in retrying Ambien  with awareness of side effects. Advised on risks and recommended lower dose to reduce side effects. - Prescribe Ambien  10 mg tablets with instructions to break in half if needed to reduce side effects.

## 2024-05-02 ENCOUNTER — Ambulatory Visit: Admitting: Internal Medicine

## 2024-10-08 ENCOUNTER — Other Ambulatory Visit: Payer: Self-pay | Admitting: Internal Medicine

## 2024-10-09 NOTE — Telephone Encounter (Signed)
Zolpidem refilled.

## 2024-10-09 NOTE — Telephone Encounter (Signed)
 Refill request for zolpidem .  Last OV 01/30/2024 Dr. Neysa.  Per chart note:   Prescribe Ambien  10 mg tablets with instructions to break in half if needed to reduce side effects.

## 2025-03-24 ENCOUNTER — Ambulatory Visit: Admitting: Urology
